# Patient Record
Sex: Female | Born: 1970 | Race: White | Hispanic: No | Smoking: Never smoker
Health system: Southern US, Community
[De-identification: ages and names within clinical notes are randomized; demographics above are authoritative.]

## PROBLEM LIST (undated history)

## (undated) DIAGNOSIS — F419 Anxiety disorder, unspecified: Secondary | ICD-10-CM

## (undated) DIAGNOSIS — R011 Cardiac murmur, unspecified: Secondary | ICD-10-CM

## (undated) DIAGNOSIS — K589 Irritable bowel syndrome without diarrhea: Secondary | ICD-10-CM

## (undated) DIAGNOSIS — I10 Essential (primary) hypertension: Secondary | ICD-10-CM

## (undated) DIAGNOSIS — F329 Major depressive disorder, single episode, unspecified: Secondary | ICD-10-CM

## (undated) DIAGNOSIS — Z8489 Family history of other specified conditions: Secondary | ICD-10-CM

## (undated) DIAGNOSIS — K219 Gastro-esophageal reflux disease without esophagitis: Secondary | ICD-10-CM

## (undated) DIAGNOSIS — N80A0 Endometriosis of bladder, unspecified depth: Secondary | ICD-10-CM

## (undated) DIAGNOSIS — N808 Other endometriosis: Secondary | ICD-10-CM

## (undated) DIAGNOSIS — F32A Depression, unspecified: Secondary | ICD-10-CM

## (undated) DIAGNOSIS — T7840XA Allergy, unspecified, initial encounter: Secondary | ICD-10-CM

## (undated) HISTORY — DX: Cardiac murmur, unspecified: R01.1

## (undated) HISTORY — PX: ABLATION ON ENDOMETRIOSIS: SHX5787

## (undated) HISTORY — DX: Irritable bowel syndrome, unspecified: K58.9

## (undated) HISTORY — DX: Endometriosis of bladder, unspecified depth: N80.A0

## (undated) HISTORY — DX: Other endometriosis: N80.8

## (undated) HISTORY — DX: Depression, unspecified: F32.A

## (undated) HISTORY — DX: Major depressive disorder, single episode, unspecified: F32.9

## (undated) HISTORY — DX: Allergy, unspecified, initial encounter: T78.40XA

## (undated) HISTORY — DX: Anxiety disorder, unspecified: F41.9

---

## 2000-01-25 ENCOUNTER — Other Ambulatory Visit: Admission: RE | Admit: 2000-01-25 | Discharge: 2000-01-25 | Payer: Self-pay | Admitting: Obstetrics and Gynecology

## 2001-02-18 ENCOUNTER — Other Ambulatory Visit: Admission: RE | Admit: 2001-02-18 | Discharge: 2001-02-18 | Payer: Self-pay | Admitting: Obstetrics and Gynecology

## 2001-10-08 ENCOUNTER — Ambulatory Visit (HOSPITAL_COMMUNITY): Admission: RE | Admit: 2001-10-08 | Discharge: 2001-10-08 | Payer: Self-pay | Admitting: Obstetrics and Gynecology

## 2002-03-27 ENCOUNTER — Other Ambulatory Visit: Admission: RE | Admit: 2002-03-27 | Discharge: 2002-03-27 | Payer: Self-pay | Admitting: Obstetrics and Gynecology

## 2003-05-26 ENCOUNTER — Other Ambulatory Visit: Admission: RE | Admit: 2003-05-26 | Discharge: 2003-05-26 | Payer: Self-pay | Admitting: Obstetrics and Gynecology

## 2014-08-03 ENCOUNTER — Ambulatory Visit (INDEPENDENT_AMBULATORY_CARE_PROVIDER_SITE_OTHER): Payer: Self-pay | Admitting: Family Medicine

## 2014-08-03 VITALS — BP 130/90 | HR 66 | Temp 98.0°F | Resp 18 | Ht 61.0 in | Wt 220.6 lb

## 2014-08-03 DIAGNOSIS — Z13220 Encounter for screening for lipoid disorders: Secondary | ICD-10-CM

## 2014-08-03 DIAGNOSIS — B3731 Acute candidiasis of vulva and vagina: Secondary | ICD-10-CM

## 2014-08-03 DIAGNOSIS — I10 Essential (primary) hypertension: Secondary | ICD-10-CM | POA: Insufficient documentation

## 2014-08-03 DIAGNOSIS — B373 Candidiasis of vulva and vagina: Secondary | ICD-10-CM

## 2014-08-03 DIAGNOSIS — R0789 Other chest pain: Secondary | ICD-10-CM

## 2014-08-03 DIAGNOSIS — R300 Dysuria: Secondary | ICD-10-CM

## 2014-08-03 DIAGNOSIS — R0602 Shortness of breath: Secondary | ICD-10-CM

## 2014-08-03 DIAGNOSIS — E669 Obesity, unspecified: Secondary | ICD-10-CM | POA: Insufficient documentation

## 2014-08-03 DIAGNOSIS — Z131 Encounter for screening for diabetes mellitus: Secondary | ICD-10-CM

## 2014-08-03 DIAGNOSIS — Z6841 Body Mass Index (BMI) 40.0 and over, adult: Secondary | ICD-10-CM | POA: Insufficient documentation

## 2014-08-03 LAB — POCT URINALYSIS DIPSTICK
Bilirubin, UA: NEGATIVE
GLUCOSE UA: NEGATIVE
KETONES UA: NEGATIVE
Leukocytes, UA: NEGATIVE
Nitrite, UA: NEGATIVE
RBC UA: NEGATIVE
Spec Grav, UA: 1.015
Urobilinogen, UA: 1
pH, UA: 7.5

## 2014-08-03 LAB — COMPREHENSIVE METABOLIC PANEL
ALK PHOS: 54 U/L (ref 39–117)
ALT: 13 U/L (ref 0–35)
AST: 16 U/L (ref 0–37)
Albumin: 4.3 g/dL (ref 3.5–5.2)
BUN: 22 mg/dL (ref 6–23)
CHLORIDE: 101 meq/L (ref 96–112)
CO2: 27 meq/L (ref 19–32)
Calcium: 9.7 mg/dL (ref 8.4–10.5)
Creat: 0.72 mg/dL (ref 0.50–1.10)
Glucose, Bld: 88 mg/dL (ref 70–99)
Potassium: 5 mEq/L (ref 3.5–5.3)
Sodium: 137 mEq/L (ref 135–145)
Total Bilirubin: 0.6 mg/dL (ref 0.2–1.2)
Total Protein: 7.9 g/dL (ref 6.0–8.3)

## 2014-08-03 LAB — POCT UA - MICROSCOPIC ONLY
CASTS, UR, LPF, POC: NEGATIVE
Crystals, Ur, HPF, POC: NEGATIVE
WBC, Ur, HPF, POC: NEGATIVE
Yeast, UA: POSITIVE

## 2014-08-03 LAB — LIPID PANEL
Cholesterol: 231 mg/dL — ABNORMAL HIGH (ref 0–200)
HDL: 61 mg/dL (ref >=46)
LDL Cholesterol: 129 mg/dL — ABNORMAL HIGH (ref 0–99)
Total CHOL/HDL Ratio: 3.8 Ratio
Triglycerides: 203 mg/dL — ABNORMAL HIGH (ref <150)
VLDL: 41 mg/dL — ABNORMAL HIGH (ref 0–40)

## 2014-08-03 LAB — HEMOGLOBIN A1C
HEMOGLOBIN A1C: 5.2 % (ref <5.7)
Mean Plasma Glucose: 103 mg/dL (ref <117)

## 2014-08-03 MED ORDER — ALBUTEROL SULFATE (2.5 MG/3ML) 0.083% IN NEBU
2.5000 mg | INHALATION_SOLUTION | Freq: Once | RESPIRATORY_TRACT | Status: AC
Start: 2014-08-03 — End: 2014-08-03
  Administered 2014-08-03: 2.5 mg via RESPIRATORY_TRACT

## 2014-08-03 MED ORDER — FLUCONAZOLE 150 MG PO TABS: 150.0000 mg | ORAL_TABLET | Freq: Once | ORAL | Status: DC

## 2014-08-03 MED ORDER — GI COCKTAIL ~~LOC~~
30.0000 mL | Freq: Once | ORAL | Status: AC
  Administered 2014-08-03: 30 mL via ORAL

## 2014-08-03 MED ORDER — ATENOLOL 50 MG PO TABS: 50.0000 mg | ORAL_TABLET | Freq: Every day | ORAL | Status: DC

## 2014-08-03 NOTE — Patient Instructions (Signed)
I will be in touch with the rest of your labs asap- if you like set up your mychart account for the fastest results Try taking OTC zantac or prilosec for 2 weeks, and avoid acidic foods such as tomatoes Let me know if these measures do not resolve your chest discomfort Continue the atenolol for now It looks like your urinary sx are due to yeast vaginitis- take the diflucan as directed for this If you have any worsening or changing of your chest symptoms please seek care   Ir your BP is getting down to 120/80 or so we may need to decrease or stop your atenolol- let me know

## 2014-08-03 NOTE — Progress Notes (Addendum)
[facility] [address], [place] [address] [phone]  Date:  08/03/2014   Name:  Ashlee Farmer   DOB:  03-27-1970   MRN:  [id number]  PCP:   Duane Lope, MD    Chief Complaint: Urinary Frequency; requesting blood work; and Shortness of Breath   History of Present Illness:  Ashlee Farmer is a 44 y.o. very pleasant female patient who presents with the following:  Here today as a new patient with complaint of a few concerns A couple of weeks ago she started seeing a personal trainer who wanted her to have a check-up prior to starting her program.  She went to another office last week for this and also for concern of UTI.  She had a BP of 170/105 when she was seen at the other office, started on atenolol 50 mg once a day Also she has noted urinary frequency and urgency.  She will urinate, feel better for a few minutes and then have to go again. This has been coming and going for a few months- more persistent as of late. She has not seen any hematuria.  No abd or back pain  States that her urine was also checked at other facility but she is not sure of results  Family history of DM.   She has not had any BW done recently- would like to do so today She late ate around 9:30 am  She also has noted a chest heaviness in her left chest.  Exercise makes it BETTER.  This seemed to start when she had a URI/onset of seasonal allergies in April of this year.  She has been dealing with it off an on since then.  She notes a mild sensation of chest heaviness now.  Wonders if this might be GERD which she has had in the past She has used some sudafed- it does seem to help with congestion  She has a heart murmur but no other cardiac issues.  Her father did die last year at the age of 62 due to CV disease Never a smoker  She has cut off sugar and caffiene over the last 2 weeks.  LMP 6/30.  States no change of pregnancy Her BP at [facility] has run 150/ 105- 110, best  155/89  She admits that she has noted some vaginal discharge and itching    There are no active problems to display for this patient.   Past Medical History  Diagnosis Date  . Allergy   . Anxiety   . Depression   . Heart murmur   . Endometriosis of bladder   . IBS (irritable bowel syndrome)     History reviewed. No pertinent past surgical history.  History  Substance Use Topics  . Smoking status: Never Smoker   . Smokeless tobacco: Not on file  . Alcohol Use: Not on file    Family History  Problem Relation Age of Onset  . Diabetes Mother   . Hyperlipidemia Mother   . Hypertension Mother   . Diabetes Father   . Heart disease Father   . Hyperlipidemia Father   . Hypertension Father   . Hyperlipidemia Brother   . Hypertension Brother     Allergies not on file  Medication list has been reviewed and updated.  No current outpatient prescriptions on file prior to visit.   No current facility-administered medications on file prior to visit.    Review of Systems:  As per HPI-  otherwise negative.   Physical Examination: Filed Vitals:   08/03/14 1240  BP: 130/90  Pulse: 66  Temp: 98 F (36.7 C)  Resp: 18   Filed Vitals:   08/03/14 1240  Height:  (1.549 m)  Weight: 220 lb 9.6 oz (100.064 kg)   Body mass index is 41.7 kg/(m^2). Ideal Body Weight: Weight in (lb) to have BMI = 25: 132  GEN: WDWN, NAD, Non-toxic, A & O x 3, obese, looks well HEENT: Atraumatic, Normocephalic. Neck supple. No masses, No LAD. Ears and Nose: No external deformity. CV: RRR, No M/G/R. No JVD. No thrill. No extra heart sounds.  Cannot reproduce chest sx by pressing on chest wall PULM: CTA B, no wheezes, crackles, rhonchi. No retractions. No resp. distress. No accessory muscle use. ABD: S, NT, ND EXTR: No c/c/e NEURO Normal gait.  PSYCH: Normally interactive. Conversant. Not depressed or anxious appearing.  Calm demeanor.   albuterol neb; did not change her sx GI  cocktail- resolved sx of chest tightness and heaviness complelty  Assessment and Plan: Dysuria - Plan: POCT urinalysis dipstick, POCT UA - Microscopic Only, Urine culture, fluconazole (DIFLUCAN) 150 MG tablet  Screening for diabetes mellitus - Plan: Comprehensive metabolic panel, Hemoglobin A1c  Screening for hyperlipidemia - Plan: Lipid panel  SOB (shortness of breath) - Plan: albuterol (PROVENTIL) (2.5 MG/3ML) 0.083% nebulizer solution 2.5 mg  Chest discomfort - Plan: gi cocktail (Maalox,Lidocaine,Donnatal)  Yeast vaginitis - Plan: fluconazole (DIFLUCAN) 150 MG tablet  Essential hypertension - Plan: atenolol (TENORMIN) 50 MG tablet  Here today with a few concerns Screening labs pending as above She has noted intermittent chest pressure for a few month, wonders if it might be GERD A GI cocktail resolved her current chest sx.  Offered an EKG and/ or CXR to look for any other possible pathology.  She declines for now in the interest of cost, but will seek care if this continues to occur or does not resolve with treatment for GERD It appears that her urinary sx are likely due to yeast vaginitis- treat with diflucan She is ok to exercise as she likes, but consider a stress test as she gets older given her dad's history  Signed Abbe Amsterdam, MD  7/14: Received her urine culture- positive for enterococcus.  Called but did not reach. Tried again 7/15: still no answer.  LMOM that I am sending an rx for UTI to her drug store. Will send a letter abut other labs Results for orders placed or performed in visit on 08/03/14  Urine culture  Result Value Ref Range   Colony Count 60,000 COLONIES/ML    Preliminary Report ENTEROCOCCUS SPECIES   Comprehensive metabolic panel  Result Value Ref Range   Sodium 137 135 - 145 mEq/L   Potassium 5.0 3.5 - 5.3 mEq/L   Chloride 101 96 - 112 mEq/L   CO2 27 19 - 32 mEq/L   Glucose, Bld 88 70 - 99 mg/dL   BUN 22 6 - 23 mg/dL   Creat 1.61 0.96 - 0.45  mg/dL   Total Bilirubin 0.6 0.2 - 1.2 mg/dL   Alkaline Phosphatase 54 39 - 117 U/L   AST 16 0 - 37 U/L   ALT 13 0 - 35 U/L   Total Protein 7.9 6.0 - 8.3 g/dL   Albumin 4.3 3.5 - 5.2 g/dL   Calcium 9.7 8.4 - 40.9 mg/dL  Lipid panel  Result Value Ref Range   Cholesterol 231 (H) 0 - 200 mg/dL  Triglycerides 203 (H) <150 mg/dL   HDL 61 >=62>=46 mg/dL   Total CHOL/HDL Ratio 3.8 Ratio   VLDL 41 (H) 0 - 40 mg/dL   LDL Cholesterol 130129 (H) 0 - 99 mg/dL  Hemoglobin Q6VA1c  Result Value Ref Range   Hgb A1c MFr Bld 5.2 <5.7 %   Mean Plasma Glucose 103 <117 mg/dL  POCT urinalysis dipstick  Result Value Ref Range   Color, UA yellow    Clarity, UA clear    Glucose, UA negative    Bilirubin, UA negative    Ketones, UA negative    Spec Grav, UA 1.015    Blood, UA negative    pH, UA 7.5    Protein, UA trace    Urobilinogen, UA 1.0    Nitrite, UA negative    Leukocytes, UA Negative Negative  POCT UA - Microscopic Only  Result Value Ref Range   WBC, Ur, HPF, POC negative    RBC, urine, microscopic 1-4    Bacteria, U Microscopic trace    Mucus, UA trace    Epithelial cells, urine per micros 0-7    Crystals, Ur, HPF, POC negative    Casts, Ur, LPF, POC negative    Yeast, UA positive

## 2014-08-05 ENCOUNTER — Telehealth: Payer: Self-pay

## 2014-08-05 NOTE — Telephone Encounter (Signed)
PATIENT STATES SHE SAW DR. Patsy LagerOPLAND ON Monday FOR HER HIGH BLOOD PRESSURE. SHE NEEDS DR. Patsy LagerOPLAND TO WRITE A NOTE FOR HER GYM TRAINER SAYING THAT IT IS ALL RIGHT FOR HER TO WORK OUT AND THAT SHE DOES NOT HAVE ANY RESTRICTIONS. SHE WILL PICK THE NOTE UP WHEN IT IS READY.  BEST PHONE [phone] (CELL) MBC

## 2014-08-05 NOTE — Telephone Encounter (Signed)
Called her and will give her a note for her personal trainer to pick up tomorrow . She is feeling well, urine culture is still pending

## 2014-08-07 ENCOUNTER — Encounter: Payer: Self-pay | Admitting: Family Medicine

## 2014-08-07 LAB — URINE CULTURE

## 2014-08-07 MED ORDER — NITROFURANTOIN MONOHYD MACRO 100 MG PO CAPS: 100.0000 mg | ORAL_CAPSULE | Freq: Two times a day (BID) | ORAL | Status: DC

## 2014-08-07 NOTE — Addendum Note (Signed)
Addended by: Abbe AmsterdamOPLAND, Cathye Kreiter C on: 08/07/2014 12:53 PM   Modules accepted: Orders

## 2014-08-29 ENCOUNTER — Ambulatory Visit (INDEPENDENT_AMBULATORY_CARE_PROVIDER_SITE_OTHER): Payer: Self-pay | Admitting: Emergency Medicine

## 2014-08-29 VITALS — BP 124/92 | HR 63 | Temp 98.0°F | Ht 63.0 in | Wt 217.0 lb

## 2014-08-29 DIAGNOSIS — H6983 Other specified disorders of Eustachian tube, bilateral: Secondary | ICD-10-CM

## 2014-08-29 DIAGNOSIS — H60333 Swimmer's ear, bilateral: Secondary | ICD-10-CM

## 2014-08-29 MED ORDER — MOMETASONE FUROATE 50 MCG/ACT NA SUSP: 2.0000 | Freq: Every day | NASAL | Status: DC

## 2014-08-29 MED ORDER — NEOMYCIN-COLIST-HC-THONZONIUM 3.3-3-10-0.5 MG/ML OT SUSP: 3.0000 [drp] | Freq: Four times a day (QID) | OTIC | Status: DC

## 2014-08-29 MED ORDER — NEOMYCIN-POLYMYXIN-HC 3.5-10000-1 OT SOLN: 3.0000 [drp] | Freq: Four times a day (QID) | OTIC | Status: DC

## 2014-08-29 NOTE — Addendum Note (Signed)
Addended by: Felix Ahmadi A on: 08/29/2014 11:25 AM   Modules accepted: Orders, Medications

## 2014-08-29 NOTE — Progress Notes (Signed)
Subjective:  Patient ID: Ashlee Farmer, female    DOB: 1970/08/25  Age: 44 y.o. MRN: [id number]  CC: Ear Pain and Nasal Congestion   HPI Ashlee Farmer presents  with pain in both ears. She said she's been swimming a lot recently has no drainage or fever or chills. Has no cough or coryza. She has no postnasal drainage or sore throat. She has difficulty clearing both ears. Said no improvement with over-the-counter medication has no particular factor that makes pain worse or better.  History Ashlee Farmer has a past medical history of Allergy; Anxiety; Depression; Heart murmur; Endometriosis of bladder; and IBS (irritable bowel syndrome).   She has no past surgical history on file.   Her  family history includes Diabetes in her father and mother; Heart disease in her father; Hyperlipidemia in her brother, father, and mother; Hypertension in her brother, father, and mother.  She   reports that she has never smoked. She does not have any smokeless tobacco history on file. She reports that she does not use illicit drugs. Her alcohol history is not on file.  Outpatient Prescriptions Prior to Visit  Medication Sig Dispense Refill  . atenolol (TENORMIN) 50 MG tablet Take 1 tablet (50 mg total) by mouth daily. 30 tablet 6  . Pseudoephedrine-DM-GG (SUDAFED COUGH PO) Take by mouth.    . fluconazole (DIFLUCAN) 150 MG tablet Take 1 tablet (150 mg total) by mouth once. Repeat in a week as needed for yeast vaginitis 2 tablet 0  . nitrofurantoin, macrocrystal-monohydrate, (MACROBID) 100 MG capsule Take 1 capsule (100 mg total) by mouth 2 (two) times daily. 14 capsule 0   No facility-administered medications prior to visit.    History   Social History  . Marital Status: Divorced    Spouse Name: N/A  . Number of Children: N/A  . Years of Education: N/A   Social History Main Topics  . Smoking status: Never Smoker   . Smokeless tobacco: Not on file  . Alcohol Use: Not on file  . Drug Use: No    . Sexual Activity: Not on file   Other Topics Concern  . None   Social History Narrative     Review of Systems  Constitutional: Negative for fever, chills and appetite change.  HENT: Negative for congestion, ear pain, postnasal drip, sinus pressure and sore throat.   Eyes: Negative for pain and redness.  Respiratory: Negative for cough, shortness of breath and wheezing.   Cardiovascular: Negative for leg swelling.  Gastrointestinal: Negative for nausea, vomiting, abdominal pain, diarrhea, constipation and blood in stool.  Endocrine: Negative for polyuria.  Genitourinary: Negative for dysuria, urgency, frequency and flank pain.  Musculoskeletal: Negative for gait problem.  Skin: Negative for rash.  Neurological: Negative for weakness and headaches.  Psychiatric/Behavioral: Negative for confusion and decreased concentration. The patient is not nervous/anxious.     Objective:  BP 124/92 mmHg  Pulse 63  Temp(Src) 98 F (36.7 C) (Oral)  Ht 5\' 3"  (1.6 m)  Wt 217 lb (98.431 kg)  BMI 38.45 kg/m2  SpO2 98%  LMP 07/23/2014 (LMP Unknown)  Physical Exam  Constitutional: She is oriented to person, place, and time. She appears well-developed and well-nourished.  HENT:  Head: Normocephalic and atraumatic.  Eyes: Conjunctivae are normal. Pupils are equal, round, and reactive to light.  Pulmonary/Chest: Effort normal.  Musculoskeletal: She exhibits no edema.  Neurological: She is alert and oriented to person, place, and time.  Skin: Skin is dry.  Psychiatric:  She has a normal mood and affect. Her behavior is normal. Thought content normal.      Assessment & Plan:   Ashlee Farmer was seen today for ear pain and nasal congestion.  Diagnoses and all orders for this visit:  Swimmer's ear of both sides  Eustachian tube dysfunction, bilateral  Other orders -     mometasone (NASONEX) 50 MCG/ACT nasal spray; Place 2 sprays into the nose daily. -      neomycin-colistin-hydrocortisone-thonzonium (CORTISPORIN-TC) 3.03-25-08-0.5 MG/ML otic suspension; Place 3 drops into both ears 4 (four) times daily.   I have discontinued Ms. Dietzman fluconazole and nitrofurantoin (macrocrystal-monohydrate). I am also having her start on mometasone and neomycin-colistin-hydrocortisone-thonzonium. Additionally, I am having her maintain her Pseudoephedrine-DM-GG (SUDAFED COUGH PO) and atenolol.  Meds ordered this encounter  Medications  . mometasone (NASONEX) 50 MCG/ACT nasal spray    Sig: Place 2 sprays into the nose daily.    Dispense:  17 g    Refill:  12  . neomycin-colistin-hydrocortisone-thonzonium (CORTISPORIN-TC) 3.03-25-08-0.5 MG/ML otic suspension    Sig: Place 3 drops into both ears 4 (four) times daily.    Dispense:  10 mL    Refill:  0    Appropriate red flag conditions were discussed with the patient as well as actions that should be taken.  Patient expressed his understanding.  Follow-up: Return if symptoms worsen or fail to improve.  Carmelina Dane, MD

## 2014-08-29 NOTE — Patient Instructions (Signed)

## 2015-01-11 ENCOUNTER — Ambulatory Visit (INDEPENDENT_AMBULATORY_CARE_PROVIDER_SITE_OTHER): Payer: Self-pay | Admitting: Family Medicine

## 2015-01-11 VITALS — BP 160/110 | HR 87 | Temp 98.9°F | Ht 62.5 in | Wt 232.0 lb

## 2015-01-11 DIAGNOSIS — J0101 Acute recurrent maxillary sinusitis: Secondary | ICD-10-CM

## 2015-01-11 MED ORDER — AMOXICILLIN 500 MG PO TABS: 1000.0000 mg | ORAL_TABLET | Freq: Three times a day (TID) | ORAL | Status: DC

## 2015-01-11 MED ORDER — FLUTICASONE PROPIONATE 50 MCG/ACT NA SUSP: 2.0000 | Freq: Every day | NASAL | Status: DC

## 2015-01-11 NOTE — Patient Instructions (Signed)

## 2015-01-11 NOTE — Progress Notes (Signed)
Subjective:  This chart was scribed for  Ashlee SorensonEva Shaw MD, by [name],scribe, at [facility].  This patient was seen in room 13 and the patient's care was started at 8:48 AM.   Chief Complaint  Patient presents with  . Sinusitis    x 1 week.  Pain in L/cheek, teeth and ear  . Otalgia    left sided  . Nasal Congestion  . Hypertension    at check in  - 160/110     Patient ID: Ashlee DibbleSheila A Dittus, female    DOB: [birth year], 44 y.o.   MRN: [id number]  HPI  HPI Comments: Ashlee Farmer is a [age] y.o. female who presents to the [facility] complaining of ear pain, nasal congestion/sinus pressure, and left sided facial pain which she states she has been having for the past couple of months.  She does have a dental issue on the left side of her mouth but states that she has not yet seen a dentist for it.  Patient notes of having sinus issues frequently.  She has been using sudafed pressure and pain as well as aleve but denies any relief.  She last took her medication this morning.   She is not using Nasonex ( due to financial reasons)  but uses a EchoStareti Pot as well as peroxide in her ears occasionally. Denies fever/chills, sore throat, cough.    Past Medical History  Diagnosis Date  . Allergy   . Anxiety   . Depression   . Heart murmur   . Endometriosis of bladder   . IBS (irritable bowel syndrome)    Current Outpatient Prescriptions on File Prior to Visit  Medication Sig Dispense Refill  . atenolol (TENORMIN) 50 MG tablet Take 1 tablet (50 mg total) by mouth daily. 30 tablet 6  . mometasone (NASONEX) 50 MCG/ACT nasal spray Place 2 sprays into the nose daily. 17 g 12  . neomycin-polymyxin-hydrocortisone (CORTISPORIN) otic solution Place 3 drops into both ears 4 (four) times daily. (Patient not taking: Reported on 01/11/2015) 10 mL 0   No current facility-administered medications on file prior to visit.   No Known Allergies   Review of Systems    Constitutional: Negative for fever and chills.  HENT: Positive for congestion, dental problem, ear pain, postnasal drip and sinus pressure. Negative for sore throat.   Eyes: Negative for pain, redness and itching.  Respiratory: Negative for cough, choking and shortness of breath.   Musculoskeletal: Negative for gait problem, neck pain and neck stiffness.  Neurological: Negative for syncope and speech difficulty.       Objective:   Physical Exam  Constitutional: She is oriented to person, place, and time. She appears well-developed and well-nourished. No distress.  HENT:  Head: Normocephalic and atraumatic.  Left Tm normal, right TM with a mid ear effusion.  septal deviation to the left.  Little cobblestoning and post nasal drip.   Eyes: Pupils are equal, round, and reactive to light.  Neck: Normal range of motion. No thyromegaly present.  No pre or post auricular adenopathy.  Anterior cervical adenopathy.  TMJ normal, no pain at mastoid or angle at mandible.   Cardiovascular: Normal rate, regular rhythm, S1 normal, S2 normal and normal heart sounds.  Exam reveals no gallop and no friction rub.   No murmur heard. Pulmonary/Chest:  Little bronchial breath sounds on the left but good air movement and clear to auscultation.   Musculoskeletal: Normal range of  motion.  Lymphadenopathy:    She has no cervical adenopathy.  Neurological: She is alert and oriented to person, place, and time.  Skin: Skin is warm and dry.  Psychiatric: She has a normal mood and affect. Her behavior is normal.    Filed Vitals:   01/11/15 0834  BP: 160/110  Pulse: 87  Temp: 98.9 F (37.2 C)  TempSrc: Oral  Height: 5' 2.5" (1.588 m)  Weight: 232 lb (105.235 kg)  SpO2: 99%         Assessment & Plan:   If no improvement within three days, call and we will switch to Augmentin or Z-pack.   1. Acute recurrent maxillary sinusitis     Meds ordered this encounter  Medications  . amoxicillin  (AMOXIL) 500 MG tablet    Sig: Take 2 tablets (1,000 mg total) by mouth 3 (three) times daily. For 2 days and then 2 tabs po bid x 8d    Dispense:  44 tablet    Refill:  0  . fluticasone (FLONASE) 50 MCG/ACT nasal spray    Sig: Place 2 sprays into both nostrils at bedtime.    Dispense:  16 g    Refill:  2    I personally performed the services described in this documentation, which was scribed in my presence. The recorded information has been reviewed and considered, and addended by me as needed.  Ashlee Sorenson, MD MPH

## 2015-01-27 ENCOUNTER — Telehealth: Payer: Self-pay

## 2015-01-27 MED ORDER — PREDNISONE 20 MG PO TABS: ORAL_TABLET | ORAL | Status: DC

## 2015-01-27 MED ORDER — SULFAMETHOXAZOLE-TRIMETHOPRIM 800-160 MG PO TABS: 1.0000 | ORAL_TABLET | Freq: Two times a day (BID) | ORAL | Status: DC

## 2015-01-27 NOTE — Telephone Encounter (Signed)
Assessment & Plan:   If no improvement within three days, call and we will switch to Augmentin or Z-pack.   1. Acute recurrent maxillary sinusitis

## 2015-01-27 NOTE — Telephone Encounter (Signed)
Pt was seen on 12/19 by Dr. Clelia CroftShaw for Acute recurrent maxillary sinusitis - Primary. She has finished her antibiotic a few days ago and is now experiencing pain and pressure. She states she was told she could get a steroid if this didn't clear up. She would like us to use the Pharmacy:  [id number] - [place] [employer], [place] - [address] CB #   [phone]

## 2015-01-27 NOTE — Telephone Encounter (Signed)
Sent in course of prednisone and bactrim.  She needs to go to get her blood pressure checked asap.  If her sxs recur after this, then she definitely needs to be seen as there  could be other causes of her sxs that we would not want to ignore.

## 2015-01-28 NOTE — Telephone Encounter (Signed)
Called pt and advised message from provider on their voicemail.  

## 2015-07-01 ENCOUNTER — Ambulatory Visit (INDEPENDENT_AMBULATORY_CARE_PROVIDER_SITE_OTHER): Payer: Self-pay | Admitting: Physician Assistant

## 2015-07-01 VITALS — BP 132/80 | HR 88 | Temp 98.6°F | Resp 17 | Ht 63.0 in | Wt 245.0 lb

## 2015-07-01 DIAGNOSIS — R6884 Jaw pain: Secondary | ICD-10-CM

## 2015-07-01 LAB — POCT CBC
Granulocyte percent: 73.1 % (ref 37–80)
HCT, POC: 39.5 % (ref 37.7–47.9)
Hemoglobin: 13.3 g/dL (ref 12.2–16.2)
Lymph, poc: 1.9 (ref 0.6–3.4)
MCH, POC: 29.6 pg (ref 27–31.2)
MCHC: 33.7 g/dL (ref 31.8–35.4)
MCV: 87.8 fL (ref 80–97)
MID (cbc): 0.6 (ref 0–0.9)
MPV: 7.3 fL (ref 0–99.8)
POC Granulocyte: 6.7 (ref 2–6.9)
POC LYMPH PERCENT: 20.6 % (ref 10–50)
POC MID %: 6.3 % (ref 0–12)
Platelet Count, POC: 257 K/uL (ref 142–424)
RBC: 4.49 M/uL (ref 4.04–5.48)
RDW, POC: 14.1 %
WBC: 9.2 K/uL (ref 4.6–10.2)

## 2015-07-01 MED ORDER — PREDNISONE 20 MG PO TABS: ORAL_TABLET | ORAL | Status: AC

## 2015-07-01 NOTE — Patient Instructions (Signed)
     IF you received an x-ray today, you will receive an invoice from [facility]. Please contact [facility] at [phone] with questions or concerns regarding your invoice.   IF you received labwork today, you will receive an invoice from [facility]. Please contact [facility] at [phone] with questions or concerns regarding your invoice.   Our billing staff will not be able to assist you with questions regarding bills from these companies.  You will be contacted with the lab results as soon as they are available. The fastest way to get your results is to activate your My Chart account. Instructions are located on the last page of this paperwork. If you have not heard from us regarding the results in 2 weeks, please contact this office.      

## 2015-07-01 NOTE — Progress Notes (Signed)
07/01/2015 11:52 AM   DOB: 1970-06-27 / MRN: [id number]  SUBJECTIVE:  Lorna DibbleSheila A Simi is a 45 y.o. female presenting for left sided ear pain and jaw pain.  Complains of a chronic history of this but has been worse in the last 5 days.  She also has a history of impacted wisdom teeth.  Feels that she is getting worse.  Opening her jaw makes the pain worse and she is unable to fully open her mouth.  She is tolerating po however.  She denies fever, chills, nausea.  The last time she had bad jaw pain she came here and was started on Abx which did not help, and was subsequently placed on steroids and this alleviated her symptoms.  She has no insurance, does not seen the dentist and has never seen an ENT either.   She has No Known Allergies.   She  has a past medical history of Allergy; Anxiety; Depression; Heart murmur; Endometriosis of bladder; and IBS (irritable bowel syndrome).    She  reports that she has never smoked. She does not have any smokeless tobacco history on file. She reports that she does not use illicit drugs. She  has no sexual activity history on file. The patient  has no past surgical history on file.  Her family history includes Diabetes in her father and mother; Heart disease in her father; Hyperlipidemia in her brother, father, and mother; Hypertension in her brother, father, and mother.  Review of Systems  Constitutional: Negative for fever and chills.  HENT: Positive for ear pain. Negative for congestion, ear discharge, nosebleeds and sore throat.   Respiratory: Negative for cough.   Cardiovascular: Negative for chest pain.  Skin: Negative for rash.  Neurological: Negative for dizziness and headaches.    Problem list and medications reviewed and updated by myself where necessary, and exist elsewhere in the encounter.   OBJECTIVE:  BP 132/80 mmHg  Pulse 88  Temp(Src) 98.6 F (37 C) (Oral)  Resp 17  Ht 5\' 3"  (1.6 m)  Wt 245 lb (111.131 kg)  BMI 43.41 kg/m2  SpO2  97%  LMP 06/17/2015  Physical Exam  Constitutional: She is oriented to person, place, and time. She appears well-developed.  Eyes: EOM are normal. Pupils are equal, round, and reactive to light.  Cardiovascular: Normal rate, regular rhythm and normal heart sounds.   Pulmonary/Chest: Effort normal and breath sounds normal.  Abdominal: She exhibits no distension.  Musculoskeletal: Normal range of motion.  Neurological: She is alert and oriented to person, place, and time. No cranial nerve deficit.  Skin: Skin is warm and dry. She is not diaphoretic.  Psychiatric: She has a normal mood and affect.  Vitals reviewed.   Results for orders placed or performed in visit on 07/01/15 (from the past 72 hour(s))  POCT CBC     Status: None   Collection Time: 07/01/15 11:42 AM  Result Value Ref Range   WBC 9.2 4.6 - 10.2 K/uL   Lymph, poc 1.9 0.6 - 3.4   POC LYMPH PERCENT 20.6 10 - 50 %L   MID (cbc) 0.6 0 - 0.9   POC MID % 6.3 0 - 12 %M   POC Granulocyte 6.7 2 - 6.9   Granulocyte percent 73.1 37 - 80 %G   RBC 4.49 4.04 - 5.48 M/uL   Hemoglobin 13.3 12.2 - 16.2 g/dL   HCT, POC 21.339.5 08.637.7 - 47.9 %   MCV 87.8 80 - 97 fL   MCH,  POC 29.6 27 - 31.2 pg   MCHC 33.7 31.8 - 35.4 g/dL   RDW, POC 40.9 %   Platelet Count, POC 257 142 - 424 K/uL   MPV 7.3 0 - 99.8 fL    No results found.  ASSESSMENT AND PLAN  Evonna was seen today for otitis media and jaw pain.  Diagnoses and all orders for this visit:  Jaw pain: CBC negative.  Will go ahead and get her prednisone as this worked last time.  I advised that we xray the jaw today but she is self pay and concerned with cost.  Advised that she get insurance and see a dentist or an ENT.  -     POCT CBC   The patient was advised to call or return to clinic if she does not see an improvement in symptoms or to seek the care of the closest emergency department if she worsens with the above plan.   Deliah Boston, MHS, PA-C [facility] 07/01/2015 11:52 AM

## 2020-08-29 ENCOUNTER — Other Ambulatory Visit: Payer: Self-pay

## 2020-08-29 ENCOUNTER — Inpatient Hospital Stay (HOSPITAL_BASED_OUTPATIENT_CLINIC_OR_DEPARTMENT_OTHER)
Admission: EM | Admit: 2020-08-29 | Discharge: 2020-09-02 | DRG: 176 | Disposition: A | Discharge status: Home / Self Care | Payer: Self-pay | Attending: Internal Medicine | Admitting: Internal Medicine

## 2020-08-29 ENCOUNTER — Encounter (HOSPITAL_BASED_OUTPATIENT_CLINIC_OR_DEPARTMENT_OTHER): Payer: Self-pay | Admitting: Emergency Medicine

## 2020-08-29 ENCOUNTER — Emergency Department: Payer: Self-pay

## 2020-08-29 ENCOUNTER — Emergency Department (HOSPITAL_BASED_OUTPATIENT_CLINIC_OR_DEPARTMENT_OTHER): Payer: Self-pay

## 2020-08-29 DIAGNOSIS — I169 Hypertensive crisis, unspecified: Secondary | ICD-10-CM | POA: Diagnosis present

## 2020-08-29 DIAGNOSIS — I10 Essential (primary) hypertension: Secondary | ICD-10-CM | POA: Diagnosis present

## 2020-08-29 DIAGNOSIS — R109 Unspecified abdominal pain: Secondary | ICD-10-CM

## 2020-08-29 DIAGNOSIS — K8020 Calculus of gallbladder without cholecystitis without obstruction: Secondary | ICD-10-CM | POA: Diagnosis present

## 2020-08-29 DIAGNOSIS — Z83438 Family history of other disorder of lipoprotein metabolism and other lipidemia: Secondary | ICD-10-CM

## 2020-08-29 DIAGNOSIS — R0602 Shortness of breath: Secondary | ICD-10-CM

## 2020-08-29 DIAGNOSIS — K589 Irritable bowel syndrome without diarrhea: Secondary | ICD-10-CM | POA: Diagnosis present

## 2020-08-29 DIAGNOSIS — Z6841 Body Mass Index (BMI) 40.0 and over, adult: Secondary | ICD-10-CM

## 2020-08-29 DIAGNOSIS — Z8249 Family history of ischemic heart disease and other diseases of the circulatory system: Secondary | ICD-10-CM

## 2020-08-29 DIAGNOSIS — R252 Cramp and spasm: Secondary | ICD-10-CM | POA: Diagnosis present

## 2020-08-29 DIAGNOSIS — Z597 Insufficient social insurance and welfare support: Secondary | ICD-10-CM

## 2020-08-29 DIAGNOSIS — Z7982 Long term (current) use of aspirin: Secondary | ICD-10-CM

## 2020-08-29 DIAGNOSIS — K760 Fatty (change of) liver, not elsewhere classified: Secondary | ICD-10-CM | POA: Diagnosis present

## 2020-08-29 DIAGNOSIS — R1900 Intra-abdominal and pelvic swelling, mass and lump, unspecified site: Secondary | ICD-10-CM | POA: Diagnosis present

## 2020-08-29 DIAGNOSIS — E785 Hyperlipidemia, unspecified: Secondary | ICD-10-CM | POA: Diagnosis present

## 2020-08-29 DIAGNOSIS — I2699 Other pulmonary embolism without acute cor pulmonale: Principal | ICD-10-CM | POA: Diagnosis present

## 2020-08-29 DIAGNOSIS — Z20822 Contact with and (suspected) exposure to covid-19: Secondary | ICD-10-CM | POA: Diagnosis present

## 2020-08-29 DIAGNOSIS — I161 Hypertensive emergency: Secondary | ICD-10-CM | POA: Diagnosis present

## 2020-08-29 DIAGNOSIS — Z833 Family history of diabetes mellitus: Secondary | ICD-10-CM

## 2020-08-29 DIAGNOSIS — K219 Gastro-esophageal reflux disease without esophagitis: Secondary | ICD-10-CM | POA: Diagnosis present

## 2020-08-29 HISTORY — DX: Other pulmonary embolism without acute cor pulmonale: I26.99

## 2020-08-29 LAB — CBC
HCT: 42.5 % (ref 36.0–46.0)
Hemoglobin: 13.7 g/dL (ref 12.0–15.0)
MCH: 28.2 pg (ref 26.0–34.0)
MCHC: 32.2 g/dL (ref 30.0–36.0)
MCV: 87.6 fL (ref 80.0–100.0)
Platelets: 156 10*3/uL (ref 150–400)
RBC: 4.85 MIL/uL (ref 3.87–5.11)
RDW: 14.1 % (ref 11.5–15.5)
WBC: 8.9 10*3/uL (ref 4.0–10.5)
nRBC: 0 % (ref 0.0–0.2)

## 2020-08-29 LAB — BASIC METABOLIC PANEL
Anion gap: 12 (ref 5–15)
BUN: 14 mg/dL (ref 6–20)
CO2: 27 mmol/L (ref 22–32)
Calcium: 9.8 mg/dL (ref 8.9–10.3)
Chloride: 99 mmol/L (ref 98–111)
Creatinine, Ser: 0.9 mg/dL (ref 0.44–1.00)
GFR, Estimated: >60 mL/min (ref >60)
Glucose, Bld: 100 mg/dL — ABNORMAL HIGH (ref 70–99)
Potassium: 4 mmol/L (ref 3.5–5.1)
Sodium: 138 mmol/L (ref 135–145)

## 2020-08-29 LAB — PREGNANCY, URINE: Preg Test, Ur: NEGATIVE

## 2020-08-29 LAB — TROPONIN I (HIGH SENSITIVITY)
Troponin I (High Sensitivity): 5 ng/L (ref <18)
Troponin I (High Sensitivity): 7 ng/L (ref <18)

## 2020-08-29 LAB — RESP PANEL BY RT-PCR (FLU A&B, COVID) ARPGX2
Influenza A by PCR: NEGATIVE
Influenza B by PCR: NEGATIVE
SARS Coronavirus 2 by RT PCR: NEGATIVE

## 2020-08-29 MED ORDER — NICARDIPINE HCL IN NACL 20-0.86 MG/200ML-% IV SOLN
3.0000 mg/h | INTRAVENOUS | Status: DC
  Administered 2020-08-29: 5 mg/h via INTRAVENOUS
  Filled 2020-08-29: qty 200

## 2020-08-29 MED ORDER — ENOXAPARIN SODIUM 150 MG/ML IJ SOSY
1.0000 mg/kg | PREFILLED_SYRINGE | Freq: Two times a day (BID) | INTRAMUSCULAR | Status: DC
  Administered 2020-08-29: 126 mg via SUBCUTANEOUS
  Filled 2020-08-29: qty 1

## 2020-08-29 MED ORDER — IOHEXOL 350 MG/ML SOLN
100.0000 mL | Freq: Once | INTRAVENOUS | Status: AC | PRN
  Administered 2020-08-29: 100 mL via INTRAVENOUS

## 2020-08-29 MED ORDER — LABETALOL HCL 5 MG/ML IV SOLN
20.0000 mg | Freq: Once | INTRAVENOUS | Status: AC
  Administered 2020-08-29: 20 mg via INTRAVENOUS
  Filled 2020-08-29: qty 4

## 2020-08-29 MED ORDER — ASPIRIN 81 MG PO CHEW
324.0000 mg | CHEWABLE_TABLET | Freq: Once | ORAL | Status: DC
  Filled 2020-08-29 (×2): qty 4

## 2020-08-29 MED ORDER — NITROGLYCERIN 0.4 MG SL SUBL
0.4000 mg | SUBLINGUAL_TABLET | SUBLINGUAL | Status: AC | PRN
Start: 2020-08-29 — End: 2020-09-01
  Administered 2020-08-29 – 2020-09-01 (×3): 0.4 mg via SUBLINGUAL
  Filled 2020-08-29 (×3): qty 1

## 2020-08-29 MED ORDER — LABETALOL HCL 5 MG/ML IV SOLN
10.0000 mg | Freq: Once | INTRAVENOUS | Status: AC
  Administered 2020-08-29: 10 mg via INTRAVENOUS
  Filled 2020-08-29: qty 4

## 2020-08-29 NOTE — ED Notes (Signed)
Dr Criss Alvine aware of pts HTN

## 2020-08-29 NOTE — ED Triage Notes (Signed)
Pt presents to ED POV. Pt c/o CP that radiates towards back w/ SOB. Pt reports that she woke up w/ it this morning.

## 2020-08-29 NOTE — ED Notes (Signed)
Pt states she has a lot of Gas, does not feel like reflux just a gas feeling

## 2020-08-29 NOTE — ED Provider Notes (Signed)
[facility] EMERGENCY DEPT Provider Note   CSN: [id number] Arrival date & time: 08/29/20  1656     History Chief Complaint  Patient presents with   Chest Pain    Ashlee Farmer is a 50 y.o. female.  HPI 50 year old female presents with back and chest pain and dyspnea.  Tells me originally this started today but then later it seems like it has started over the past several days but got worse today.  EMS was called to her house because of dyspnea few days ago and told her her blood pressure was high but she does not know the number.  She used to have hypertension and then due to exercising she took her self off of meds.  Today the pain is primarily her left back but also in her chest which feels like a squeezing.  Right now those pains are much better but she has a little bit of dyspnea and a feeling of not been able to get a full breath in her left lower chest/upper abdomen.  Past Medical History:  Diagnosis Date   Allergy    Anxiety    Depression    Endometriosis of bladder    Heart murmur    IBS (irritable bowel syndrome)     Patient Active Problem List   Diagnosis Date Noted   Hypertensive crisis 08/29/2020   Obesity 08/03/2014   Essential hypertension 08/03/2014    History reviewed. No pertinent surgical history.   OB History   No obstetric history on file.     Family History  Problem Relation Age of Onset   Diabetes Mother    Hyperlipidemia Mother    Hypertension Mother    Diabetes Father    Heart disease Father    Hyperlipidemia Father    Hypertension Father    Hyperlipidemia Brother    Hypertension Brother     Social History   Tobacco Use   Smoking status: Never  Substance Use Topics   Drug use: No    Home Medications Prior to Admission medications   Medication Sig Start Date End Date Taking? Authorizing Provider  fluticasone (FLONASE) 50 MCG/ACT nasal spray Place 2 sprays into both nostrils at bedtime. 01/11/15   Sherren Mocha, MD   pseudoephedrine (SUDAFED) 60 MG tablet Take 60 mg by mouth every 4 (four) hours as needed for congestion.    [provider]    Allergies    Patient has no known allergies.  Review of Systems   Review of Systems  Respiratory:  Positive for shortness of breath.   Cardiovascular:  Positive for chest pain.  Gastrointestinal:  Positive for abdominal pain and nausea.  Musculoskeletal:  Positive for back pain.  Neurological:  Positive for dizziness.  All other systems reviewed and are negative.  Physical Exam Updated Vital Signs BP (!) 159/92   Pulse 95   Temp 98.6 F (37 C) (Oral)   Resp 13   Ht 5\' 1"  (1.549 m)   Wt 127 kg   SpO2 95%   BMI 52.91 kg/m   Physical Exam Vitals and nursing note reviewed.  Constitutional:      General: She is not in acute distress.    Appearance: She is well-developed. She is obese. She is not ill-appearing or diaphoretic.  HENT:     Head: Normocephalic and atraumatic.     Right Ear: External ear normal.     Left Ear: External ear normal.     Nose: Nose normal.  Eyes:  General:        Right eye: No discharge.        Left eye: No discharge.  Cardiovascular:     Rate and Rhythm: Regular rhythm. Tachycardia present.     Heart sounds: Normal heart sounds.  Pulmonary:     Effort: Pulmonary effort is normal.     Breath sounds: Normal breath sounds.  Abdominal:     Palpations: Abdomen is soft.     Tenderness: There is no abdominal tenderness.  Musculoskeletal:     Thoracic back: No tenderness.  Skin:    General: Skin is warm and dry.  Neurological:     Mental Status: She is alert.  Psychiatric:        Mood and Affect: Mood is not anxious.    ED Results / Procedures / Treatments   Labs (all labs ordered are listed, but only abnormal results are displayed) Labs Reviewed  BASIC METABOLIC PANEL - Abnormal; Notable for the following components:      Result Value   Glucose, Bld 100 (*)    All other components within normal  limits  RESP PANEL BY RT-PCR (FLU A&B, COVID) ARPGX2  CBC  PREGNANCY, URINE  TROPONIN I (HIGH SENSITIVITY)  TROPONIN I (HIGH SENSITIVITY)    EKG EKG Interpretation  Date/Time:  Sunday August 29 2020 17:06:24 EDT Ventricular Rate:  105 PR Interval:  172 QRS Duration: 95 QT Interval:  341 QTC Calculation: 451 R Axis:   2 Text Interpretation: Sinus tachycardia no obvious ischemia No old tracing to compare Confirmed by Pricilla Loveless [id number]) on 08/29/2020 5:28:13 PM  Radiology DG Chest 2 View  Result Date: 08/29/2020 CLINICAL DATA:  Chest pain. EXAM: CHEST - 2 VIEW COMPARISON:  June 29, 2009. FINDINGS: Low lung volumes. Borderline enlargement the cardiac silhouette. No consolidation. No visible pleural effusions or pneumothorax. Mild S shaped thoracolumbar curvature. IMPRESSION: 1. Low lung volumes without evidence of acute cardiopulmonary disease. 2. Borderline cardiomegaly. Electronically Signed   By: Feliberto Harts MD   On: 08/29/2020 17:41   CT Angio Chest/Abd/Pel for Dissection W and/or Wo Contrast  Result Date: 08/29/2020 CLINICAL DATA:  Chest and abdominal pain EXAM: CT ANGIOGRAPHY CHEST, ABDOMEN AND PELVIS TECHNIQUE: Non-contrast CT of the chest was initially obtained. Multidetector CT imaging through the chest, abdomen and pelvis was performed using the standard protocol during bolus administration of intravenous contrast. Multiplanar reconstructed images and MIPs were obtained and reviewed to evaluate the vascular anatomy. CONTRAST:  OMNIPAQUE IOHEXOL 350 MG/ML SOLN COMPARISON:  None. FINDINGS: CTA CHEST FINDINGS Cardiovascular: Initial precontrast images demonstrate no evidence of hyperdense crescent to suggest acute aortic injury. Post-contrast images demonstrate the thoracic aorta to be well opacified without evidence of aneurysmal dilatation or dissection. No cardiac enlargement is seen. No coronary calcifications are noted. The pulmonary artery as visualized shows a normal  branching pattern. Small filling defect is noted within the right lower lobe pulmonary arterial branch centrally. This is best visualized on coronal image number 53 of series 8 and axial image number 58 of series 5. No other definitive filling defects are identified to suggest pulmonary embolism. Mediastinum/Nodes: Thoracic inlet is within normal limits. No sizable hilar or mediastinal adenopathy is noted. The esophagus as visualized is within normal limits. Lungs/Pleura: The lungs are well aerated bilaterally. No focal infiltrate or sizable effusion is noted. No parenchymal nodules are seen. Musculoskeletal: Thoracic spine is within normal limits. No acute rib abnormality is noted. Review of the MIP images confirms the above  findings. CTA ABDOMEN AND PELVIS FINDINGS VASCULAR Aorta: Abdominal aorta demonstrates atherosclerotic calcifications without aneurysmal dilatation or dissection. Celiac: Patent without evidence of aneurysm, dissection, vasculitis or significant stenosis. SMA: Patent without evidence of aneurysm, dissection, vasculitis or significant stenosis. Renals: Dual renal arteries are noted on the right. Single renal artery is noted on the left. No focal stenosis is seen. IMA: Patent without evidence of aneurysm, dissection, vasculitis or significant stenosis. Inflow: Patent without evidence of aneurysm, dissection, vasculitis or significant stenosis. Veins: No specific venous abnormality is noted. Review of the MIP images confirms the above findings. NON-VASCULAR Hepatobiliary: Liver demonstrates diffuse decreased attenuation consistent with fatty infiltration. Cholelithiasis is noted without evidence of complicating factors. A small left hepatic cyst is noted. Pancreas: Unremarkable. No pancreatic ductal dilatation or surrounding inflammatory changes. Spleen: Normal in size without focal abnormality. Adrenals/Urinary Tract: Adrenal glands are within normal limits. Kidneys demonstrate a normal  enhancement pattern bilaterally. No renal calculi or obstructive changes are seen. The ureters are within normal limits. Bladder is partially distended. Stomach/Bowel: Appendix is well visualized within normal limits. No obstructive or inflammatory changes of the colon are seen. Small bowel and stomach appear within normal limits. Lymphatic: No sizable lymphadenopathy is noted. Reproductive: Uterus is well visualized and within normal limits. Soft tissue density is noted along the superior aspect of the uterus posteriorly best seen on image number 119 of series 9 and image number 243 of series 5. This measures at least 4.8 cm in greatest dimension. This may represent an exophytic fibroid although the possibility of adnexal lesion could not be totally excluded. Other: No abdominal wall hernia or abnormality. No abdominopelvic ascites. Musculoskeletal: Bilateral pars defects are noted at L5. Mild anterolisthesis is noted of L5 on S1. No other bony abnormality is seen. Review of the MIP images confirms the above findings. IMPRESSION: CTA of the chest: No evidence of aortic dilatation or dissection. Small right lower lobe pulmonary embolus without evidence of right heart strain. CTA of the abdomen and pelvis: No aortic abnormality is identified. Cholelithiasis without complicating factors. Soft tissue density along the posterosuperior aspect of the uterus as described. This could represent an exophytic fibroid posteriorly although the possibility of left adnexal mass would deserve consideration as well. Ultrasound pelvis is recommended for further evaluation and delineation. Electronically Signed   By: Alcide CleverMark  Lukens M.D.   On: 08/29/2020 19:19    Procedures .Critical Care  Date/Time: 08/29/2020 11:10 PM Performed by: Pricilla LovelessGoldston, Kaneesha Constantino, MD Authorized by: Pricilla LovelessGoldston, Veona Bittman, MD   Critical care provider statement:    Critical care time (minutes):  40   Critical care time was exclusive of:  Separately billable procedures  and treating other patients   Critical care was necessary to treat or prevent imminent or life-threatening deterioration of the following conditions:  Cardiac failure   Critical care was time spent personally by me on the following activities:  Discussions with consultants, evaluation of patient's response to treatment, examination of patient, ordering and performing treatments and interventions, ordering and review of laboratory studies, ordering and review of radiographic studies, pulse oximetry, re-evaluation of patient's condition, obtaining history from patient or surrogate and review of old charts   Medications Ordered in ED Medications  nitroGLYCERIN (NITROSTAT) SL tablet 0.4 mg (0.4 mg Sublingual Given 08/29/20 1842)  nicardipine (CARDENE) 20mg  in 0.86% saline 200ml IV infusion (0.1 mg/ml) (7.5 mg/hr Intravenous Rate/Dose Change 08/29/20 2231)  enoxaparin (LOVENOX) injection 126 mg (126 mg Subcutaneous Given 08/29/20 2155)  iohexol (OMNIPAQUE) 350 MG/ML injection  100 mL (100 mLs Intravenous Contrast Given 08/29/20 1823)  labetalol (NORMODYNE) injection 10 mg (10 mg Intravenous Given 08/29/20 1925)  labetalol (NORMODYNE) injection 20 mg (20 mg Intravenous Given 08/29/20 2019)  labetalol (NORMODYNE) injection 20 mg (20 mg Intravenous Given 08/29/20 2113)    ED Course  I have reviewed the triage vital signs and the nursing notes.  Pertinent labs & imaging results that were available during my care of the patient were reviewed by me and considered in my medical decision making (see chart for details).    MDM Rules/Calculators/A&P                           Patient presents with nonspecific chest/back pain as well as a feeling of unable to get a full breath.  Quite hypertensive on arrival and a little tachycardic.  Given the back/chest pain a CTA was obtained to rule out dissection.  No dissection but there appears to be one solitary PE.  She is not hypoxic.  Labetalol has not helped much with the blood  pressure.  Troponins are negative x2.  Overall I think she is having symptomatic hypertension and given the degree I think she will need IV treatments and admission.  Discussed with Dr. Carren Rang who will start on cardene drip.  Advises Lovenox for the PE.  Will admit. Final Clinical Impression(s) / ED Diagnoses Final diagnoses:  Hypertensive crisis    Rx / DC Orders ED Discharge Orders     None        Pricilla Loveless, MD 08/29/20 2311

## 2020-08-30 ENCOUNTER — Inpatient Hospital Stay (HOSPITAL_COMMUNITY): Payer: Self-pay

## 2020-08-30 ENCOUNTER — Encounter (HOSPITAL_COMMUNITY): Payer: Self-pay | Admitting: Family Medicine

## 2020-08-30 DIAGNOSIS — I169 Hypertensive crisis, unspecified: Secondary | ICD-10-CM

## 2020-08-30 DIAGNOSIS — M79605 Pain in left leg: Secondary | ICD-10-CM

## 2020-08-30 MED ORDER — FLEET ENEMA 7-19 GM/118ML RE ENEM: 1.0000 | ENEMA | Freq: Once | RECTAL | Status: DC | PRN

## 2020-08-30 MED ORDER — POLYETHYLENE GLYCOL 3350 17 G PO PACK: 17.0000 g | PACK | Freq: Every day | ORAL | Status: DC | PRN

## 2020-08-30 MED ORDER — ACETAMINOPHEN 325 MG PO TABS
650.0000 mg | ORAL_TABLET | Freq: Four times a day (QID) | ORAL | Status: DC | PRN
  Administered 2020-08-30 – 2020-09-02 (×5): 650 mg via ORAL
  Filled 2020-08-30 (×4): qty 2

## 2020-08-30 MED ORDER — HYDROCODONE-ACETAMINOPHEN 5-325 MG PO TABS: 1.0000 | ORAL_TABLET | ORAL | Status: DC | PRN

## 2020-08-30 MED ORDER — ACETAMINOPHEN 650 MG RE SUPP: 650.0000 mg | Freq: Four times a day (QID) | RECTAL | Status: DC | PRN

## 2020-08-30 MED ORDER — ENOXAPARIN SODIUM 150 MG/ML IJ SOSY
1.0000 mg/kg | PREFILLED_SYRINGE | Freq: Two times a day (BID) | INTRAMUSCULAR | Status: DC
  Administered 2020-08-30 – 2020-09-01 (×6): 126 mg via SUBCUTANEOUS
  Filled 2020-08-30 (×7): qty 0.84

## 2020-08-30 MED ORDER — BISACODYL 5 MG PO TBEC: 5.0000 mg | DELAYED_RELEASE_TABLET | Freq: Every day | ORAL | Status: DC | PRN

## 2020-08-30 MED ORDER — LOSARTAN POTASSIUM 50 MG PO TABS
50.0000 mg | ORAL_TABLET | Freq: Every day | ORAL | Status: DC
  Administered 2020-08-30 – 2020-09-02 (×4): 50 mg via ORAL
  Filled 2020-08-30 (×4): qty 1

## 2020-08-30 MED ORDER — HYDRALAZINE HCL 20 MG/ML IJ SOLN
5.0000 mg | Freq: Four times a day (QID) | INTRAMUSCULAR | Status: DC | PRN
  Administered 2020-08-30: 5 mg via INTRAVENOUS
  Filled 2020-08-30: qty 1

## 2020-08-30 MED ORDER — AMLODIPINE BESYLATE 10 MG PO TABS
10.0000 mg | ORAL_TABLET | Freq: Every day | ORAL | Status: DC
  Administered 2020-08-30 – 2020-09-02 (×4): 10 mg via ORAL
  Filled 2020-08-30: qty 2
  Filled 2020-08-30 (×4): qty 1

## 2020-08-30 MED ORDER — AMLODIPINE BESYLATE 5 MG PO TABS
10.0000 mg | ORAL_TABLET | Freq: Once | ORAL | Status: AC
  Administered 2020-08-30: 10 mg via ORAL

## 2020-08-30 MED ORDER — APIXABAN 5 MG PO TABS: 5.0000 mg | ORAL_TABLET | Freq: Two times a day (BID) | ORAL | Status: DC

## 2020-08-30 MED ORDER — MORPHINE SULFATE (PF) 2 MG/ML IV SOLN
2.0000 mg | INTRAVENOUS | Status: DC | PRN
Start: 2020-08-30 — End: 2020-09-01
  Filled 2020-08-30: qty 1

## 2020-08-30 MED ORDER — ONDANSETRON HCL 4 MG/2ML IJ SOLN: 4.0000 mg | Freq: Four times a day (QID) | INTRAMUSCULAR | Status: DC | PRN

## 2020-08-30 MED ORDER — ACETAMINOPHEN 325 MG PO TABS
650.0000 mg | ORAL_TABLET | Freq: Once | ORAL | Status: AC
  Administered 2020-08-30: 650 mg via ORAL
  Filled 2020-08-30: qty 2

## 2020-08-30 MED ORDER — APIXABAN 5 MG PO TABS
10.0000 mg | ORAL_TABLET | Freq: Two times a day (BID) | ORAL | Status: DC
Start: 2020-08-30 — End: 2020-08-30

## 2020-08-30 MED ORDER — METHOCARBAMOL 1000 MG/10ML IJ SOLN
500.0000 mg | Freq: Four times a day (QID) | INTRAVENOUS | Status: DC | PRN
  Filled 2020-08-30: qty 5

## 2020-08-30 MED ORDER — ONDANSETRON HCL 4 MG PO TABS: 4.0000 mg | ORAL_TABLET | Freq: Four times a day (QID) | ORAL | Status: DC | PRN

## 2020-08-30 NOTE — Plan of Care (Signed)
  Problem: Education: Goal: Knowledge of General Education information will improve Description: Including pain rating scale, medication(s)/side effects and non-pharmacologic comfort measures Outcome: Completed/Met   Problem: Health Behavior/Discharge Planning: Goal: Ability to manage health-related needs will improve Outcome: Completed/Met   Problem: Clinical Measurements: Goal: Ability to maintain clinical measurements within normal limits will improve Outcome: Completed/Met Goal: Will remain free from infection Outcome: Completed/Met Goal: Diagnostic test results will improve Outcome: Completed/Met Goal: Respiratory complications will improve Outcome: Completed/Met Goal: Cardiovascular complication will be avoided Outcome: Completed/Met   Problem: Activity: Goal: Risk for activity intolerance will decrease Outcome: Completed/Met   Problem: Nutrition: Goal: Adequate nutrition will be maintained Outcome: Completed/Met   Problem: Coping: Goal: Level of anxiety will decrease Outcome: Completed/Met   Problem: Elimination: Goal: Will not experience complications related to bowel motility Outcome: Completed/Met Goal: Will not experience complications related to urinary retention Outcome: Completed/Met   Problem: Coping: Goal: Level of anxiety will decrease Outcome: Completed/Met   Problem: Elimination: Goal: Will not experience complications related to bowel motility Outcome: Completed/Met Goal: Will not experience complications related to urinary retention Outcome: Completed/Met   Problem: Pain Managment: Goal: General experience of comfort will improve Outcome: Completed/Met   Problem: Safety: Goal: Ability to remain free from injury will improve Outcome: Completed/Met   Problem: Skin Integrity: Goal: Risk for impaired skin integrity will decrease Outcome: Completed/Met

## 2020-08-30 NOTE — Progress Notes (Addendum)
ANTICOAGULATION CONSULT NOTE - Follow Up Consult  Pharmacy Consult for Apixaban Indication: pulmonary embolus  No Known Allergies  Patient Measurements: Height: 5\' 1"  (154.9 cm) Weight: 127 kg (280 lb) IBW/kg (Calculated) : 47.8   Vital Signs: Temp: 98.6 F (37 C) (08/08 1000) Temp Source: Oral (08/08 1000) BP: 191/91 (08/08 1000) Pulse Rate: 89 (08/08 1000)  Labs: Recent Labs    08/29/20 1703 08/29/20 1903  HGB 13.7  --   HCT 42.5  --   PLT 156  --   CREATININE 0.90  --   TROPONINIHS 5 7    Estimated Creatinine Clearance: 91.8 mL/min (by C-G formula based on SCr of 0.9 mg/dL).   Medications:  Scheduled:   amLODipine  10 mg Oral Daily   losartan  50 mg Oral Daily    Assessment: 50 yo female with new small PE, no anticoagulation PTA. MD wishes to transition Lovenox to a DOAC (apixaban). CBC stable   Goal of Therapy:  Prevention of VTE recoccurance Monitor platelets by anticoagulation protocol: Yes   Plan:  D/C lovenox Give Apixaban 10mg  PO BID x 7 days, then 5mg  BID thereafter.  Monitor CBC and for bleeding Patient will be educated prior to discharge.  Recommend case management consult for apixaban coverage determination.   Anaisha Mago A. 42, PharmD, BCPS, FNKF Clinical Pharmacist [place] Please utilize Amion for appropriate phone number to reach the unit pharmacist [facility])  Addendum:  MD wishes to continue lovenox for now. Apixaban order cancelled (did not receive a dose). Will resume lovenox at prior dose and frequency.   Nashid Pellum A. , PharmD, BCPS, FNKF Clinical Pharmacist [place] Please utilize Amion for appropriate phone number to reach the unit pharmacist [facility])   08/30/2020,10:32 AM

## 2020-08-30 NOTE — Progress Notes (Signed)
Left lower extremity venous duplex has been completed. Preliminary results can be found in CV Proc through chart review.   08/30/20 2:29 PM Olen Cordial RVT

## 2020-08-30 NOTE — H&P (Addendum)
History and Physical    Ashlee Farmer ZOX:[id number] DOB: [birth year] DOA: 08/29/2020  PCP: Daisy Florooss, Charles Alan, MD  Chief Complaint: Chest pain, shortness of breath  HPI: Ashlee DibbleSheila A Farmer is a 50 y.o. female with a past medical history of super morbid obesity, history of hypertension but not on any oral antihypertensives for approximately 10 years, IBS, history of heart murmur diagnosed as a kid and told to take antibiotics before dental visits, GERD.  She does not follow closely with a physician.  States its been many years since she last saw a physician prior to coming to [facility] yesterday.  She started having shortness of breath yesterday morning.  Worsened with laying flat.  Occurred at rest.  No cough, no wheezing.  No fevers or chills.  Also noted some chest tightness and upper abdominal pain.  Located substernal and radiated to the back.  No history of CVA or MI.  No previous history of PE, DVT.  No history of cancer.  He states she only takes Prilosec over-the-counter as needed for acid reflux.  In the past week she started taking aspirin 81 mg daily.  No severe headaches prior to come to the ER.  Once her blood pressure has come down after treatment she is stating she is now having some headaches.  States that she does not follow closely with a doctor because she does not have insurance.  Chest pain is improved. Troponins were cycled and were unremarkable.  CT PE study was obtained to rule out dissection.  Showed a small pulmonary embolism.  The patient was given 3 doses of IV labetalol.  [facility].  EKG showed sinus tachycardia.  Initial SBP was 250 on arrival at [facility].  She states she has a history of heart murmur diagnosed as a kid but cannot explain any further history regarding this.  States that she has been told by a doctor in the past to take antibiotics before dental visits.  She also endorses some left leg cramping.  No history of COPD or asthma.  No  recent prolonged travel.    ED Course: Chest x-ray, CT angio chest, abdomen, pelvis with and without contrast.  EKG, troponins, BNP, CBC, urine pregnancy, flu A/B PCR, COVID-19 PCR.  Tylenol 650 mg p.o. x1, labetalol 10 mg IV x1, labetalol 20 mg IV x2, amlodipine 10 mg p.o. x1.  Review of Systems: 14 point review of systems is negative except for what is mentioned above in the HPI.   Past Medical History:  Diagnosis Date   Allergy    Anxiety    Depression    Endometriosis of bladder    Heart murmur    IBS (irritable bowel syndrome)     History reviewed. No pertinent surgical history.  Social History   Socioeconomic History   Marital status: Divorced    Spouse name: Not on file   Number of children: Not on file   Years of education: Not on file   Highest education level: Not on file  Occupational History   Not on file  Tobacco Use   Smoking status: Never   Smokeless tobacco: Never  Substance and Sexual Activity   Alcohol use: Not on file   Drug use: No   Sexual activity: Not on file  Other Topics Concern   Not on file  Social History Narrative   Not on file   Social Determinants of Health   Financial Resource Strain: Not on file  Food Insecurity: Not on file  Transportation Needs: Not on file  Physical Activity: Not on file  Stress: Not on file  Social Connections: Not on file  Intimate Partner Violence: Not on file    No Known Allergies  Family History  Problem Relation Age of Onset   Diabetes Mother    Hyperlipidemia Mother    Hypertension Mother    Diabetes Father    Heart disease Father    Hyperlipidemia Father    Hypertension Father    Hyperlipidemia Brother    Hypertension Brother     Prior to Admission medications   Not on File    Physical Exam: Vitals:   08/30/20 0700 08/30/20 0730 08/30/20 0801 08/30/20 1000  BP: (!) 177/97 (!) 190/94 (!) 194/97 (!) 191/91  Pulse: 77 86 86 89  Resp: 16 (!) 22 19 18   Temp:    98.6 F (37 C)   TempSrc:    Oral  SpO2: 97% 98% 97% 96%  Weight:      Height:         General:  Appears calm and comfortable and is in NAD Cardiovascular: Tachycardic with regular rhythm, no murmurs. Respiratory:   CTA bilaterally with no wheezes/rales/rhonchi.  Normal respiratory effort. Abdomen:  soft, NT, ND, NABS, morbidly obese abdomen Skin:  no rash or induration seen on limited exam Musculoskeletal:  grossly normal tone BUE/BLE, good ROM, no bony abnormality Lower extremity:  No LE edema.  Limited foot exam with no ulcerations.  2+ distal pulses. Psychiatric:  grossly normal mood and affect, speech fluent and appropriate, AOx3 Neurologic:  CN 2-12 grossly intact, moves all extremities in coordinated fashion, sensation intact    Radiological Exams on Admission: Independently reviewed - see discussion in A/P where applicable  DG Chest 2 View  Result Date: 08/29/2020 CLINICAL DATA:  Chest pain. EXAM: CHEST - 2 VIEW COMPARISON:  June 29, 2009. FINDINGS: Low lung volumes. Borderline enlargement the cardiac silhouette. No consolidation. No visible pleural effusions or pneumothorax. Mild S shaped thoracolumbar curvature. IMPRESSION: 1. Low lung volumes without evidence of acute cardiopulmonary disease. 2. Borderline cardiomegaly. Electronically Signed   By: July 01, 2009 MD   On: 08/29/2020 17:41   CT Angio Chest/Abd/Pel for Dissection W and/or Wo Contrast  Result Date: 08/29/2020 CLINICAL DATA:  Chest and abdominal pain EXAM: CT ANGIOGRAPHY CHEST, ABDOMEN AND PELVIS TECHNIQUE: Non-contrast CT of the chest was initially obtained. Multidetector CT imaging through the chest, abdomen and pelvis was performed using the standard protocol during bolus administration of intravenous contrast. Multiplanar reconstructed images and MIPs were obtained and reviewed to evaluate the vascular anatomy. CONTRAST:  10/29/2020 OMNIPAQUE IOHEXOL 350 MG/ML SOLN COMPARISON:  None. FINDINGS: CTA CHEST FINDINGS Cardiovascular:  Initial precontrast images demonstrate no evidence of hyperdense crescent to suggest acute aortic injury. Post-contrast images demonstrate the thoracic aorta to be well opacified without evidence of aneurysmal dilatation or dissection. No cardiac enlargement is seen. No coronary calcifications are noted. The pulmonary artery as visualized shows a normal branching pattern. Small filling defect is noted within the right lower lobe pulmonary arterial branch centrally. This is best visualized on coronal image number 53 of series 8 and axial image number 58 of series 5. No other definitive filling defects are identified to suggest pulmonary embolism. Mediastinum/Nodes: Thoracic inlet is within normal limits. No sizable hilar or mediastinal adenopathy is noted. The esophagus as visualized is within normal limits. Lungs/Pleura: The lungs are well aerated bilaterally. No focal infiltrate or sizable effusion is  noted. No parenchymal nodules are seen. Musculoskeletal: Thoracic spine is within normal limits. No acute rib abnormality is noted. Review of the MIP images confirms the above findings. CTA ABDOMEN AND PELVIS FINDINGS VASCULAR Aorta: Abdominal aorta demonstrates atherosclerotic calcifications without aneurysmal dilatation or dissection. Celiac: Patent without evidence of aneurysm, dissection, vasculitis or significant stenosis. SMA: Patent without evidence of aneurysm, dissection, vasculitis or significant stenosis. Renals: Dual renal arteries are noted on the right. Single renal artery is noted on the left. No focal stenosis is seen. IMA: Patent without evidence of aneurysm, dissection, vasculitis or significant stenosis. Inflow: Patent without evidence of aneurysm, dissection, vasculitis or significant stenosis. Veins: No specific venous abnormality is noted. Review of the MIP images confirms the above findings. NON-VASCULAR Hepatobiliary: Liver demonstrates diffuse decreased attenuation consistent with fatty  infiltration. Cholelithiasis is noted without evidence of complicating factors. A small left hepatic cyst is noted. Pancreas: Unremarkable. No pancreatic ductal dilatation or surrounding inflammatory changes. Spleen: Normal in size without focal abnormality. Adrenals/Urinary Tract: Adrenal glands are within normal limits. Kidneys demonstrate a normal enhancement pattern bilaterally. No renal calculi or obstructive changes are seen. The ureters are within normal limits. Bladder is partially distended. Stomach/Bowel: Appendix is well visualized within normal limits. No obstructive or inflammatory changes of the colon are seen. Small bowel and stomach appear within normal limits. Lymphatic: No sizable lymphadenopathy is noted. Reproductive: Uterus is well visualized and within normal limits. Soft tissue density is noted along the superior aspect of the uterus posteriorly best seen on image number 119 of series 9 and image number 243 of series 5. This measures at least 4.8 cm in greatest dimension. This may represent an exophytic fibroid although the possibility of adnexal lesion could not be totally excluded. Other: No abdominal wall hernia or abnormality. No abdominopelvic ascites. Musculoskeletal: Bilateral pars defects are noted at L5. Mild anterolisthesis is noted of L5 on S1. No other bony abnormality is seen. Review of the MIP images confirms the above findings. IMPRESSION: CTA of the chest: No evidence of aortic dilatation or dissection. Small right lower lobe pulmonary embolus without evidence of right heart strain. CTA of the abdomen and pelvis: No aortic abnormality is identified. Cholelithiasis without complicating factors. Soft tissue density along the posterosuperior aspect of the uterus as described. This could represent an exophytic fibroid posteriorly although the possibility of left adnexal mass would deserve consideration as well. Ultrasound pelvis is recommended for further evaluation and  delineation. Electronically Signed   By: Alcide Clever M.D.   On: 08/29/2020 19:19    EKG: Independently reviewed.  Sinus tach, rate 105.  No acute ischemic changes.   Labs on Admission: I have personally reviewed the available labs and imaging studies at the time of the admission.  Pertinent labs: None   Assessment/Plan: Nonspecific chest pain likely secondary to acute pulmonary embolism: The patient will be admitted to the medical/surgical floor under inpatient status.  CT PE ruled out dissection.  There was a small noted PE.  No right heart strain noted.  Now on full dose Lovenox.  We will continue this for now.  Can plan on transitioning to oral anticoagulation upon discharge.  Unfortunately the patient does not have insurance at the moment.  We will consult TOC to see if patient will qualify for Medicaid or resources to see if she could afford Eliquis/Xarelto preferably over Coumadin.  EKG showed no acute ischemic changes.  High-sensitivity troponins were unremarkable.  Acute dyspnea likely secondary to above: Plan as above.  The patient notes a history of heart murmurs.  We will obtain echocardiogram.  Hypertensive crisis: Initial systolic blood pressure at [facility] was in the 250s.  Given several doses of IV labetalol and started on amlodipine.  We will continue with amlodipine 10 mg daily.  No renal insufficiency.  Will start Cozaar 50 mg daily.  Titrate blood pressure medications to reach goal blood pressure. SBP now in 190s upon my evaluation.  Left leg cramping: We will obtain a duplex ultrasound to rule out DVT.  Incidental finding of tissue density: CT imaging noted incidental finding of soft tissue density on the posterosuperior aspect of the uterus..  Recommended obtaining a pelvic ultrasound.  Super morbid obesity: Counseled on lifestyle modifications.  Level of Care: MedSurg DVT prophylaxis: Full dose Lovenox Code Status: Full code Consults: None Admission status:  Inpatient   Verdia Kuba DO [facility]   How to contact the [facility] Attending or Consulting provider 7A - 7P or covering provider during after hours 7P -7A, for this patient?  Check the care team in [facility] and look for a) attending/consulting [facility] provider listed and b) the [facility] team listed Log into [email or web address] and use [place]'s universal password to access. If you do not have the password, please contact the hospital operator. Locate the [facility] provider you are looking for under [facility] and page to a number that you can be directly reached. If you still have difficulty reaching the provider, please page the [facility] (Director on Call) for the Hospitalists listed on amion for assistance.   08/30/2020, 11:05 AM

## 2020-08-30 NOTE — ED Notes (Addendum)
Pt family member called this RN to the bedside stating she can not breathe. Pt SP02 is 99 and heartrate is 96 in NSR. Pt states she feels like she can not take a deep breathe and has a cramping like sensation. Pt is diaphoretic in bed with lungs clear Bil in all lobes.

## 2020-08-30 NOTE — ED Notes (Signed)
Patient stated that she felt tensed on her bilateral chest 5/10.

## 2020-08-30 NOTE — ED Notes (Signed)
Attempted to call for report. 

## 2020-08-31 ENCOUNTER — Inpatient Hospital Stay (HOSPITAL_COMMUNITY): Payer: Self-pay

## 2020-08-31 DIAGNOSIS — I2699 Other pulmonary embolism without acute cor pulmonale: Principal | ICD-10-CM

## 2020-08-31 DIAGNOSIS — Z6841 Body Mass Index (BMI) 40.0 and over, adult: Secondary | ICD-10-CM

## 2020-08-31 DIAGNOSIS — R1900 Intra-abdominal and pelvic swelling, mass and lump, unspecified site: Secondary | ICD-10-CM

## 2020-08-31 DIAGNOSIS — R0609 Other forms of dyspnea: Secondary | ICD-10-CM

## 2020-08-31 LAB — LIPID PANEL
Cholesterol: 224 mg/dL — ABNORMAL HIGH (ref 0–200)
HDL: 54 mg/dL (ref >40)
LDL Cholesterol: 146 mg/dL — ABNORMAL HIGH (ref 0–99)
Total CHOL/HDL Ratio: 4.1 RATIO
Triglycerides: 119 mg/dL (ref <150)
VLDL: 24 mg/dL (ref 0–40)

## 2020-08-31 LAB — APTT: aPTT: 45 seconds — ABNORMAL HIGH (ref 24–36)

## 2020-08-31 LAB — BASIC METABOLIC PANEL
Anion gap: 13 (ref 5–15)
BUN: 14 mg/dL (ref 6–20)
CO2: 22 mmol/L (ref 22–32)
Calcium: 9.2 mg/dL (ref 8.9–10.3)
Chloride: 102 mmol/L (ref 98–111)
Creatinine, Ser: 0.95 mg/dL (ref 0.44–1.00)
GFR, Estimated: >60 mL/min (ref >60)
Glucose, Bld: 89 mg/dL (ref 70–99)
Potassium: 3.4 mmol/L — ABNORMAL LOW (ref 3.5–5.1)
Sodium: 137 mmol/L (ref 135–145)

## 2020-08-31 LAB — PROTIME-INR
INR: 1.1 (ref 0.8–1.2)
Prothrombin Time: 13.9 seconds (ref 11.4–15.2)

## 2020-08-31 LAB — CBC
HCT: 43 % (ref 36.0–46.0)
Hemoglobin: 13.6 g/dL (ref 12.0–15.0)
MCH: 28.2 pg (ref 26.0–34.0)
MCHC: 31.6 g/dL (ref 30.0–36.0)
MCV: 89.2 fL (ref 80.0–100.0)
Platelets: 267 10*3/uL (ref 150–400)
RBC: 4.82 MIL/uL (ref 3.87–5.11)
RDW: 14.6 % (ref 11.5–15.5)
WBC: 8.4 10*3/uL (ref 4.0–10.5)
nRBC: 0 % (ref 0.0–0.2)

## 2020-08-31 LAB — HIV ANTIBODY (ROUTINE TESTING W REFLEX): HIV Screen 4th Generation wRfx: NONREACTIVE

## 2020-08-31 LAB — ECHOCARDIOGRAM COMPLETE
Area-P 1/2: 4.15 cm2
Calc EF: 56 %
Height: 61 in
S' Lateral: 3 cm
Single Plane A2C EF: 53.7 %
Single Plane A4C EF: 55 %
Weight: 4480 oz

## 2020-08-31 LAB — HEMOGLOBIN A1C
Hgb A1c MFr Bld: 5.5 % (ref 4.8–5.6)
Mean Plasma Glucose: 111.15 mg/dL

## 2020-08-31 MED ORDER — LORATADINE 10 MG PO TABS: 10.0000 mg | ORAL_TABLET | Freq: Every day | ORAL | Status: DC

## 2020-08-31 MED ORDER — FLUTICASONE PROPIONATE 50 MCG/ACT NA SUSP
2.0000 | Freq: Every day | NASAL | Status: DC
  Filled 2020-08-31: qty 16

## 2020-08-31 MED ORDER — IBUPROFEN 400 MG PO TABS
400.0000 mg | ORAL_TABLET | ORAL | Status: DC | PRN
  Administered 2020-08-31 – 2020-09-01 (×2): 400 mg via ORAL
  Filled 2020-08-31 (×2): qty 1

## 2020-08-31 MED ORDER — LORATADINE 10 MG PO TABS: 10.0000 mg | ORAL_TABLET | Freq: Every day | ORAL | Status: DC | PRN

## 2020-08-31 NOTE — Progress Notes (Signed)
  Echocardiogram 2D Echocardiogram has been performed.  Ashlee Farmer 08/31/2020, 11:15 AM

## 2020-08-31 NOTE — Progress Notes (Signed)
PROGRESS NOTE    Ashlee Farmer  ZHY:[id number] DOB: 11-27-1970 DOA: 08/29/2020 PCP: Daisy Floro, MD    Chief Complaint  Patient presents with   Chest Pain    Brief Narrative:  Ashlee Farmer is a 50 y.o. female with a past medical history of super morbid obesity, history of hypertension but not on any oral antihypertensives for approximately 10 years, IBS, history of heart murmur diagnosed as a kid and told to take antibiotics before dental visits, GERD.  She does not follow closely with a physician.  States its been many years since she last saw a physician prior to coming to [facility] yesterday.  She started having shortness of breath yesterday morning.  Worsened with laying flat.  Occurred at rest.  No cough, no wheezing.  No fevers or chills.  Also noted some chest tightness and upper abdominal pain.  Located substernal and radiated to the back. She is found to have acute PE  Subjective:  Less short of breath, states she never had chest pain, states her back pain has improved Blood pressure better controlled No stomach pain, no bm for several days, she does not want stool softener  Reports Left ear feel muffled, feel drainage at the back of the throat , do not feel like using claritin or mucinex, but will consider flonase  Assessment & Plan:   Active Problems:   Hypertensive crisis   Acute PE -presents with sob -CTA showed "Small right lower lobe pulmonary embolus without evidence of right heart strain" -she is started on therapeutic Lovenox since admission -Venous Doppler no DVT -High-sensitivity troponin negative -Echocardiogram pending -She is on room air at rest -She has no insurance, no PCP, case discussed with case management for medication /pcp assistant  Hypertension emergency  Initial systolic blood pressure at [facility] was in the 250s.  Given several doses of IV labetalol and started on amlodipine and Cozaar Blood pressure is much improved She  had a CTA chest abdomen/pel did not reveal renal artery stenosis, no adrenal lesions identified -Will benefit from weight loss and outpatient sleep study -Plan to discharge on Norvasc and Cozaar -Advised patient check blood pressure at home, follow-up with PCP  Pelvic mass She denies ab /pel pain , reports no period for several years Case discussed with radiology who recommend pelvic MRI with and without contrast Check [id number] She need to follow-up with physician for [facility]  Fatty liver Cholelithiasis without complicating factors Check lipid panel and lft  Morbid obesity Body mass index is 52.91 kg/m. Advised weight loss management         Unresulted Labs (From admission, onward)     Start     Ordered   09/01/20 0500  CA 125  Tomorrow morning,   R        08/31/20 0938   09/01/20 0500  Comprehensive metabolic panel  Tomorrow morning,   R        08/31/20 0938   09/01/20 0500  Magnesium  Tomorrow morning,   R        08/31/20 0938   09/01/20 0500  TSH  Tomorrow morning,   R        08/31/20 0938   08/30/20 1025  Microalbumin / creatinine urine ratio  Once,   R        08/30/20 1025              DVT prophylaxis:   Therapeutic Lovenox   Code  Status: Full Family Communication: Patient Disposition:   Status is: Inpatient   Dispo: The patient is from: Home              Anticipated d/c is to: Home              Anticipated d/c date is: Likely on 8/10 after echocardiogram and pelvic MRI, also need to secure discharge meds/PCP appointment, case manager aware                Consultants:  None  Procedures:  None  Antimicrobials:    Anti-infectives (From admission, onward)    None          Objective: Vitals:   08/30/20 2329 08/31/20 0329 08/31/20 0413 08/31/20 0825  BP: (!) 160/81 127/75 (!) 149/89 (!) 158/95  Pulse: 90 80  83  Resp: 16 20  17   Temp: 98.4 F (36.9 C) 97.9 F (36.6 C)  98 F (36.7 C)  TempSrc: Oral Oral  Oral   SpO2: 95% 94%  96%  Weight:      Height:        Intake/Output Summary (Last 24 hours) at 08/31/2020 0938 Last data filed at 08/30/2020 1804 Gross per 24 hour  Intake 120 ml  Output 600 ml  Net -480 ml   Filed Weights   08/29/20 1701  Weight: 127 kg    Examination:  General exam: calm, NAD Respiratory system: Clear to auscultation. Respiratory effort normal. Cardiovascular system: S1 & S2 heard, RRR. No JVD, no murmur, No pedal edema. Gastrointestinal system: Abdomen is nondistended, soft and nontender. Normal bowel sounds heard. Central nervous system: Alert and oriented. No focal neurological deficits. Extremities: Symmetric 5 x 5 power. Skin: No rashes, lesions or ulcers Psychiatry: Judgement and insight appear normal. Mood & affect appropriate.     Data Reviewed: I have personally reviewed following labs and imaging studies  CBC: Recent Labs  Lab 08/29/20 1703 08/31/20 0011  WBC 8.9 8.4  HGB 13.7 13.6  HCT 42.5 43.0  MCV 87.6 89.2  PLT 156 267    Basic Metabolic Panel: Recent Labs  Lab 08/29/20 1703 08/31/20 0011  NA 138 137  K 4.0 3.4*  CL 99 102  CO2 27 22  GLUCOSE 100* 89  BUN 14 14  CREATININE 0.90 0.95  CALCIUM 9.8 9.2    GFR: Estimated Creatinine Clearance: 86.9 mL/min (by C-G formula based on SCr of 0.95 mg/dL).  Liver Function Tests: No results for input(s): AST, ALT, ALKPHOS, BILITOT, PROT, ALBUMIN in the last 168 hours.  CBG: No results for input(s): GLUCAP in the last 168 hours.   Recent Results (from the past 240 hour(s))  Resp Panel by RT-PCR (Flu A&B, Covid) Nasopharyngeal Swab     Status: None   Collection Time: 08/29/20  7:56 PM   Specimen: Nasopharyngeal Swab; Nasopharyngeal(NP) swabs in vial transport medium  Result Value Ref Range Status   SARS Coronavirus 2 by RT PCR NEGATIVE NEGATIVE Final    Comment: (NOTE) SARS-CoV-2 target nucleic acids are NOT DETECTED.  The SARS-CoV-2 RNA is generally detectable in upper  respiratory specimens during the acute phase of infection. The lowest concentration of SARS-CoV-2 viral copies this assay can detect is 138 copies/mL. A negative result does not preclude SARS-Cov-2 infection and should not be used as the sole basis for treatment or other patient management decisions. A negative result may occur with  improper specimen collection/handling, submission of specimen other than nasopharyngeal swab, presence of viral mutation(s) within  the areas targeted by this assay, and inadequate number of viral copies(<138 copies/mL). A negative result must be combined with clinical observations, patient history, and epidemiological information. The expected result is Negative.  Fact Sheet for Patients:  BloggerCourse.com  Fact Sheet for Healthcare Providers:  SeriousBroker.it  This test is no t yet approved or cleared by the [place] FDA and  has been authorized for detection and/or diagnosis of SARS-CoV-2 by FDA under an Emergency Use Authorization (EUA). This EUA will remain  in effect (meaning this test can be used) for the duration of the COVID-19 declaration under Section 564(b)(1) of the Act, 21 U.S.C.section 360bbb-3(b)(1), unless the authorization is terminated  or revoked sooner.       Influenza A by PCR NEGATIVE NEGATIVE Final   Influenza B by PCR NEGATIVE NEGATIVE Final    Comment: (NOTE) The Xpert Xpress SARS-CoV-2/FLU/RSV plus assay is intended as an aid in the diagnosis of influenza from Nasopharyngeal swab specimens and should not be used as a sole basis for treatment. Nasal washings and aspirates are unacceptable for Xpert Xpress SARS-CoV-2/FLU/RSV testing.  Fact Sheet for Patients: BloggerCourse.com  Fact Sheet for Healthcare Providers: SeriousBroker.it  This test is not yet approved or cleared by the [place] FDA and has been  authorized for detection and/or diagnosis of SARS-CoV-2 by FDA under an Emergency Use Authorization (EUA). This EUA will remain in effect (meaning this test can be used) for the duration of the COVID-19 declaration under Section 564(b)(1) of the Act, 21 U.S.C. section 360bbb-3(b)(1), unless the authorization is terminated or revoked.  Performed at [facility], [address] [place], [place] [address]          Radiology Studies: DG Chest 2 View  Result Date: 08/29/2020 CLINICAL DATA:  Chest pain. EXAM: CHEST - 2 VIEW COMPARISON:  June 29, 2009. FINDINGS: Low lung volumes. Borderline enlargement the cardiac silhouette. No consolidation. No visible pleural effusions or pneumothorax. Mild S shaped thoracolumbar curvature. IMPRESSION: 1. Low lung volumes without evidence of acute cardiopulmonary disease. 2. Borderline cardiomegaly. Electronically Signed   By: Feliberto Harts MD   On: 08/29/2020 17:41   US PELVIC COMPLETE WITH TRANSVAGINAL  Result Date: 08/30/2020 CLINICAL DATA:  Pelvic pain abnormal CT EXAM: TRANSABDOMINAL AND TRANSVAGINAL ULTRASOUND OF PELVIS TECHNIQUE: Both transabdominal and transvaginal ultrasound examinations of the pelvis were performed. Transabdominal technique was performed for global imaging of the pelvis including uterus, ovaries, adnexal regions, and pelvic cul-de-sac. It was necessary to proceed with endovaginal exam following the transabdominal exam to visualize the uterus and adnexa. COMPARISON:  CT 08/29/2020 FINDINGS: Uterus Measurements: 9.2 x 3.8 x 5.5 cm = volume: 101.8 mL. Hypoechoic mass posterior to the lower uterine segment, this measures 4.9 x 3.7 x 4.5 cm. Homogeneous low level internal echoes. No appreciable internal flow. Endometrium Thickness: 4.9 mm.  No focal abnormality visualized. Right ovary Not seen. Left ovary Not seen Other findings No abnormal free fluid. IMPRESSION: 1. 4.9 cm hypoechoic mass posterior to the lower uterine  segment. Ultrasound appearance is indeterminate for exophytic fibroid versus possible endometrioma. Suggest correlation with pelvic MRI. 2. Nonvisualized ovaries. Electronically Signed   By: Jasmine Pang M.D.   On: 08/30/2020 18:11   CT Angio Chest/Abd/Pel for Dissection W and/or Wo Contrast  Result Date: 08/29/2020 CLINICAL DATA:  Chest and abdominal pain EXAM: CT ANGIOGRAPHY CHEST, ABDOMEN AND PELVIS TECHNIQUE: Non-contrast CT of the chest was initially obtained. Multidetector CT imaging through the chest, abdomen and pelvis was performed using  the standard protocol during bolus administration of intravenous contrast. Multiplanar reconstructed images and MIPs were obtained and reviewed to evaluate the vascular anatomy. CONTRAST:  100mL OMNIPAQUE IOHEXOL 350 MG/ML SOLN COMPARISON:  None. FINDINGS: CTA CHEST FINDINGS Cardiovascular: Initial precontrast images demonstrate no evidence of hyperdense crescent to suggest acute aortic injury. Post-contrast images demonstrate the thoracic aorta to be well opacified without evidence of aneurysmal dilatation or dissection. No cardiac enlargement is seen. No coronary calcifications are noted. The pulmonary artery as visualized shows a normal branching pattern. Small filling defect is noted within the right lower lobe pulmonary arterial branch centrally. This is best visualized on coronal image number 53 of series 8 and axial image number 58 of series 5. No other definitive filling defects are identified to suggest pulmonary embolism. Mediastinum/Nodes: Thoracic inlet is within normal limits. No sizable hilar or mediastinal adenopathy is noted. The esophagus as visualized is within normal limits. Lungs/Pleura: The lungs are well aerated bilaterally. No focal infiltrate or sizable effusion is noted. No parenchymal nodules are seen. Musculoskeletal: Thoracic spine is within normal limits. No acute rib abnormality is noted. Review of the MIP images confirms the above  findings. CTA ABDOMEN AND PELVIS FINDINGS VASCULAR Aorta: Abdominal aorta demonstrates atherosclerotic calcifications without aneurysmal dilatation or dissection. Celiac: Patent without evidence of aneurysm, dissection, vasculitis or significant stenosis. SMA: Patent without evidence of aneurysm, dissection, vasculitis or significant stenosis. Renals: Dual renal arteries are noted on the right. Single renal artery is noted on the left. No focal stenosis is seen. IMA: Patent without evidence of aneurysm, dissection, vasculitis or significant stenosis. Inflow: Patent without evidence of aneurysm, dissection, vasculitis or significant stenosis. Veins: No specific venous abnormality is noted. Review of the MIP images confirms the above findings. NON-VASCULAR Hepatobiliary: Liver demonstrates diffuse decreased attenuation consistent with fatty infiltration. Cholelithiasis is noted without evidence of complicating factors. A small left hepatic cyst is noted. Pancreas: Unremarkable. No pancreatic ductal dilatation or surrounding inflammatory changes. Spleen: Normal in size without focal abnormality. Adrenals/Urinary Tract: Adrenal glands are within normal limits. Kidneys demonstrate a normal enhancement pattern bilaterally. No renal calculi or obstructive changes are seen. The ureters are within normal limits. Bladder is partially distended. Stomach/Bowel: Appendix is well visualized within normal limits. No obstructive or inflammatory changes of the colon are seen. Small bowel and stomach appear within normal limits. Lymphatic: No sizable lymphadenopathy is noted. Reproductive: Uterus is well visualized and within normal limits. Soft tissue density is noted along the superior aspect of the uterus posteriorly best seen on image number 119 of series 9 and image number 243 of series 5. This measures at least 4.8 cm in greatest dimension. This may represent an exophytic fibroid although the possibility of adnexal lesion could  not be totally excluded. Other: No abdominal wall hernia or abnormality. No abdominopelvic ascites. Musculoskeletal: Bilateral pars defects are noted at L5. Mild anterolisthesis is noted of L5 on S1. No other bony abnormality is seen. Review of the MIP images confirms the above findings. IMPRESSION: CTA of the chest: No evidence of aortic dilatation or dissection. Small right lower lobe pulmonary embolus without evidence of right heart strain. CTA of the abdomen and pelvis: No aortic abnormality is identified. Cholelithiasis without complicating factors. Soft tissue density along the posterosuperior aspect of the uterus as described. This could represent an exophytic fibroid posteriorly although the possibility of left adnexal mass would deserve consideration as well. Ultrasound pelvis is recommended for further evaluation and delineation. Electronically Signed   By: Loraine LericheMark  Lukens M.D.   On: 08/29/2020 19:19   VAS [place] LOWER EXTREMITY VENOUS (DVT)  Result Date: 08/30/2020  Lower Venous DVT Study Patient Name:  SAUDIA SMYSER  Date of Exam:   08/30/2020 Medical Rec #: [id number]        Accession #:    [id number] Date of Birth: 1970-06-26        Patient Gender: F Patient Age:   52 years Exam Location:  [facility] Procedure:      VAS [place] LOWER EXTREMITY VENOUS (DVT) Referring Phys: Lynwood Dawley --------------------------------------------------------------------------------  Indications: Pain.  Risk Factors: None identified. Limitations: Body habitus and poor ultrasound/tissue interface. Comparison Study: No prior studies. Performing Technologist: Chanda Busing RVT  Examination Guidelines: A complete evaluation includes B-mode imaging, spectral Doppler, color Doppler, and power Doppler as needed of all accessible portions of each vessel. Bilateral testing is considered an integral part of a complete examination. Limited examinations for reoccurring indications may be performed as noted. The reflux portion of the  exam is performed with the patient in reverse Trendelenburg.  +-----+---------------+---------+-----------+----------+--------------+ RIGHTCompressibilityPhasicitySpontaneityPropertiesThrombus Aging +-----+---------------+---------+-----------+----------+--------------+ CFV  Full           Yes      Yes                                 +-----+---------------+---------+-----------+----------+--------------+   +---------+---------------+---------+-----------+----------+--------------+ LEFT     CompressibilityPhasicitySpontaneityPropertiesThrombus Aging +---------+---------------+---------+-----------+----------+--------------+ CFV      Full           Yes      Yes                                 +---------+---------------+---------+-----------+----------+--------------+ SFJ      Full                                                        +---------+---------------+---------+-----------+----------+--------------+ FV Prox  Full                                                        +---------+---------------+---------+-----------+----------+--------------+ FV Mid   Full                                                        +---------+---------------+---------+-----------+----------+--------------+ FV DistalFull                                                        +---------+---------------+---------+-----------+----------+--------------+ PFV      Full                                                        +---------+---------------+---------+-----------+----------+--------------+  POP      Full           Yes      Yes                                 +---------+---------------+---------+-----------+----------+--------------+ PTV      Full                                                        +---------+---------------+---------+-----------+----------+--------------+ PERO     Full                                                         +---------+---------------+---------+-----------+----------+--------------+    Summary: RIGHT: - No evidence of common femoral vein obstruction.  LEFT: - There is no evidence of deep vein thrombosis in the lower extremity.  - No cystic structure found in the popliteal fossa.  *See table(s) above for measurements and observations. Electronically signed by Sherald Hess MD on 08/30/2020 at 4:32:54 PM.    Final         Scheduled Meds:  amLODipine  10 mg Oral Daily   enoxaparin (LOVENOX) injection  1 mg/kg Subcutaneous Q12H   losartan  50 mg Oral Daily   Continuous Infusions:  methocarbamol (ROBAXIN) IV       LOS: 1 day   Time spent: Greater than 50% of this time was spent in counseling, explanation of diagnosis, planning of further management, and coordination of care.   Voice Recognition Reubin Milan dictation system was used to create this note, attempts have been made to correct errors. Please contact the author with questions and/or clarifications.   Albertine Grates, MD PhD FACP [facility]  Available via Epic secure chat 7am-7pm for nonurgent issues Please page for urgent issues To page the attending provider between 7A-7P or the covering provider during after hours 7P-7A, please log into the web site [email or web address] and access using universal [place] password for that web site. If you do not have the password, please call the hospital operator.    08/31/2020, 9:38 AM

## 2020-08-31 NOTE — TOC Initial Note (Addendum)
Transition of Care [facility]) - Initial/Assessment Note    Patient Details  Name: Ashlee Farmer MRN: [id number] Date of Birth: July 28, 1970  [facility] CM/SW Contact:    Durenda Guthrie, RN Phone Number: 08/31/2020, 10:14 AM  Clinical Narrative:  Patient is 50 yr old female admitted with Hypertensive crisis. Case manager spoke with patient concerning medication assistance and need for PCP. Patient states she used to go to [facility], but no longer has employment or insurance, which is why she had no medications and ended up in the hospital.  CM has entered [facility] for patient and meds will be delivered from [facility]. Appointment has been scheduled for Wednesday, September  14,2022 @ 1:30pm at [facility]. Kemper Durie will see if patient can get earlier appointment and will contact patient. CM will provide patient with Eliquis patient assistance form that she will need to complete and return to company.                 1349: [facility] can not see patient earlier than 10/06/20. CM contacted Primary Care at [facility], appointment is scheduled for Thursday, September 23, 2020 at 11:20am with Dr. Andrey Campanile. Appt has been placed on AVS.  1441: CM has requested a financial counselor reach out to patient for Medicaid Screening.  Expected Discharge Plan: Home/Self Care Barriers to Discharge: No Barriers Identified   Patient Goals and CMS Choice     Choice offered to / list presented to : NA  Expected Discharge Plan and Services Expected Discharge Plan: Home/Self Care In-house Referral: PCP / [facility] Discharge Planning Services: [facility], Follow-up appt scheduled, [facility] Acute [facility] Choice: NA Living arrangements for the past 2 months: Apartment                 DME Arranged: N/A DME Agency: NA       HH Arranged: NA HH Agency: NA        Prior Living Arrangements/Services Living arrangements for the past 2 months:  Apartment Lives with:: Self   Do you feel safe going back to the place where you live?: Yes               Activities of Daily Living Home Assistive Devices/Equipment: None ADL Screening (condition at time of admission) Patient's cognitive ability adequate to safely complete daily activities?: Yes Is the patient deaf or have difficulty hearing?: No Does the patient have difficulty seeing, even when wearing glasses/contacts?: No Does the patient have difficulty concentrating, remembering, or making decisions?: No Patient able to express need for assistance with ADLs?: No Does the patient have difficulty dressing or bathing?: No Independently performs ADLs?: Yes (appropriate for developmental age) Does the patient have difficulty walking or climbing stairs?: No Weakness of Legs: None Weakness of Arms/Hands: None  Permission Sought/Granted                  Emotional Assessment       Orientation: : Oriented to Self, Oriented to Place, Oriented to  Time, Oriented to Situation Alcohol / Substance Use: Not Applicable Psych Involvement: No (comment)  Admission diagnosis:  Hypertensive crisis [I16.9] Patient Active Problem List   Diagnosis Date Noted   Hypertensive crisis 08/29/2020   Obesity 08/03/2014   Essential hypertension 08/03/2014   PCP:  Daisy Floro, MD Pharmacy:   [facility] [phone] - [place], [place] - [address] AT [address]NER OF [facility] [address] [place] [address] Phone:  [phone] Fax: [phone]     Social Determinants of Health (SDOH) Interventions    Readmission Risk Interventions No flowsheet data found.

## 2020-09-01 ENCOUNTER — Inpatient Hospital Stay (HOSPITAL_COMMUNITY): Payer: Self-pay

## 2020-09-01 LAB — LIPID PANEL
Cholesterol: 246 mg/dL — ABNORMAL HIGH (ref 0–200)
HDL: 57 mg/dL (ref >40)
LDL Cholesterol: 153 mg/dL — ABNORMAL HIGH (ref 0–99)
Total CHOL/HDL Ratio: 4.3 RATIO
Triglycerides: 178 mg/dL — ABNORMAL HIGH (ref <150)
VLDL: 36 mg/dL (ref 0–40)

## 2020-09-01 LAB — COMPREHENSIVE METABOLIC PANEL
ALT: 24 U/L (ref 0–44)
AST: 23 U/L (ref 15–41)
Albumin: 3.8 g/dL (ref 3.5–5.0)
Alkaline Phosphatase: 72 U/L (ref 38–126)
Anion gap: 9 (ref 5–15)
BUN: 20 mg/dL (ref 6–20)
CO2: 25 mmol/L (ref 22–32)
Calcium: 9.6 mg/dL (ref 8.9–10.3)
Chloride: 102 mmol/L (ref 98–111)
Creatinine, Ser: 1.03 mg/dL — ABNORMAL HIGH (ref 0.44–1.00)
GFR, Estimated: >60 mL/min (ref >60)
Glucose, Bld: 96 mg/dL (ref 70–99)
Potassium: 4 mmol/L (ref 3.5–5.1)
Sodium: 136 mmol/L (ref 135–145)
Total Bilirubin: 1.1 mg/dL (ref 0.3–1.2)
Total Protein: 7.8 g/dL (ref 6.5–8.1)

## 2020-09-01 LAB — TSH: TSH: 2.795 u[IU]/mL (ref 0.350–4.500)

## 2020-09-01 LAB — MAGNESIUM: Magnesium: 2.4 mg/dL (ref 1.7–2.4)

## 2020-09-01 LAB — TROPONIN I (HIGH SENSITIVITY)
Troponin I (High Sensitivity): 6 ng/L (ref <18)
Troponin I (High Sensitivity): 4 ng/L (ref <18)

## 2020-09-01 MED ORDER — SIMVASTATIN 20 MG PO TABS
20.0000 mg | ORAL_TABLET | Freq: Every day | ORAL | Status: DC
  Administered 2020-09-01 – 2020-09-02 (×2): 20 mg via ORAL
  Filled 2020-09-01 (×2): qty 1

## 2020-09-01 MED ORDER — SIMETHICONE 80 MG PO CHEW
80.0000 mg | CHEWABLE_TABLET | Freq: Four times a day (QID) | ORAL | Status: DC | PRN
  Administered 2020-09-01 – 2020-09-02 (×3): 80 mg via ORAL
  Filled 2020-09-01 (×3): qty 1

## 2020-09-01 MED ORDER — HYDRALAZINE HCL 25 MG PO TABS
25.0000 mg | ORAL_TABLET | Freq: Three times a day (TID) | ORAL | Status: DC
  Administered 2020-09-01 – 2020-09-02 (×4): 25 mg via ORAL
  Filled 2020-09-01 (×4): qty 1

## 2020-09-01 MED ORDER — MORPHINE SULFATE (PF) 2 MG/ML IV SOLN: 2.0000 mg | INTRAVENOUS | Status: DC | PRN

## 2020-09-01 MED ORDER — SIMETHICONE 80 MG PO CHEW
80.0000 mg | CHEWABLE_TABLET | Freq: Four times a day (QID) | ORAL | Status: DC | PRN
  Administered 2020-09-01: 80 mg via ORAL
  Filled 2020-09-01: qty 1

## 2020-09-01 MED ORDER — PANTOPRAZOLE SODIUM 40 MG PO TBEC
40.0000 mg | DELAYED_RELEASE_TABLET | Freq: Two times a day (BID) | ORAL | Status: DC
  Administered 2020-09-01 – 2020-09-02 (×3): 40 mg via ORAL
  Filled 2020-09-01 (×3): qty 1

## 2020-09-01 MED ORDER — NITROGLYCERIN 0.4 MG SL SUBL
0.4000 mg | SUBLINGUAL_TABLET | SUBLINGUAL | Status: DC | PRN
  Administered 2020-09-01: 0.4 mg via SUBLINGUAL
  Filled 2020-09-01: qty 1

## 2020-09-01 NOTE — Progress Notes (Signed)
0352- patient complaining of non-radiating pressure in chest  0255- Notified Dr. Arville Care. EKG unremarkable   0256- first dose of nitroglycerin given chest pain of 3/10. Patient placed on 2L [place].   0301- pain at 3/10 second dose of nitro given.  4818- Patient states chest discomfort has "gotten better" but now c/o that she feels like it is hard for her to breathe. Left lung sounds are diminished-unchanged from initial  shift assessment. O2 saturations at 96% on 2L   0315- Patient complaining of gas & belching. Given dose of PRN simethicone.   5909- Patient states that her symptoms are "getting better" after the nitro and simethicone   0330- spoke with Dr. Arville Care over the phone. Troponin ordered.

## 2020-09-01 NOTE — Progress Notes (Signed)
PROGRESS NOTE    Ashlee DibbleSheila A Farmer  ZOX:[id number] DOB: 1970-04-08 DOA: 08/29/2020 PCP: Daisy Florooss, Charles Alan, MD   Brief Narrative: 50 year old with past medical history significant for  morbid obesity, hypertension, not on medication for blood pressure for the last 10 years, IBS, GERD.  She presented with chest pain and shortness of breath at rest.  Also suprasternal chest pressure radiating to the back.\ Patient was found to have PE.  Assessment & Plan:   Active Problems:   Hypertensive crisis  1-Acute PE: -Patient presented with shortness of breath and chest pressure. -CTA showed small right lower lobe pulmonary embolus without evidence of right heart strain. -Echocardiogram: Normal right ventricular function. -Dopplers lower extremity negative for DVT. -troponin  negative. -Plan to transition to Eliquis tomorrow.  Social worker assisting with Eliquis.  2-Hypertensive emergency: Systolic blood pressure in the ED was 250. Patient received several doses of IV labetalol Continue with amlodipine and Cozaar. Patient continued to be elevated, plan to start hydralazine.  3-Chest Pressure, SOB; Suspect related to Pulmonary embolism, Vs GERD Vs Hypertension.  Started on PPI.  ECHO normal EF.  Added Hydralazine for better BP controlled.  Troponin times 2 normal.   4-Pelvic mass: CA125: Pending Will [facility]: Left larger than right ovarian lesions with signal characteristics most consistent with endometriomas.  5-Hyperlipidemia: Plan to start the statins 6-Fatty Liver; needs weight loss.  7-Morbid Obesity; BMI 52/    Estimated body mass index is 52.91 kg/m as calculated from the following:   Height as of this encounter: 5\' 1"  (1.549 m).   Weight as of this encounter: 127 kg.   DVT prophylaxis: Lovenox Code Status: Full Code Family Communication: Care discussed with patient Disposition Plan:  Status is:  Inpatient  Remains inpatient appropriate because:Ongoing active pain requiring inpatient pain management  Dispo: The patient is from: Home              Anticipated d/c is to: Home              Patient currently is not medically stable to d/c.   Difficult to place patient No        Consultants:  None  Procedures:  ECHO; normal EF  Antimicrobials:  None  Subjective: She report chest pressure, SOB. " Feels her blood takes time to circulate around her"  She report reflux and burping.  Report relieves for nitro and simethicone.  .   Objective: Vitals:   09/01/20 0303 09/01/20 0808 09/01/20 0945 09/01/20 1133  BP: (!) 148/82 (!) 156/105 (!) 171/86 (!) 166/98  Pulse: 89 95  93  Resp: 17 18  19   Temp:  97.6 F (36.4 C)  98 F (36.7 C)  TempSrc:  Oral  Oral  SpO2:  90%  94%  Weight:      Height:        Intake/Output Summary (Last 24 hours) at 09/01/2020 1239 Last data filed at 09/01/2020 1233 Gross per 24 hour  Intake 960 ml  Output --  Net 960 ml   Filed Weights   08/29/20 1701  Weight: 127 kg    Examination:  General exam: Appears calm and comfortable  Respiratory system: Clear to auscultation. Respiratory effort normal. Cardiovascular system: S1 & S2 heard, RRR. No JVD, murmurs, rubs, gallops or clicks. No pedal edema. Gastrointestinal system: Abdomen is nondistended, soft and nontender. No organomegaly or masses felt. Normal bowel sounds heard. Central nervous system: Alert and  oriented. Extremities: Symmetric 5 x 5 power. Skin: No rashes, lesions or ulcers    Data Reviewed: I have personally reviewed following labs and imaging studies  CBC: Recent Labs  Lab 08/29/20 1703 08/31/20 0011  WBC 8.9 8.4  HGB 13.7 13.6  HCT 42.5 43.0  MCV 87.6 89.2  PLT 156 267   Basic Metabolic Panel: Recent Labs  Lab 08/29/20 1703 08/31/20 0011 09/01/20 0130  NA 138 137 136  K 4.0 3.4* 4.0  CL 99 102 102  CO2 GLUCOSE 100* 89 96  BUN CREATININE 0.90 0.95 1.03*  CALCIUM 9.8 9.2 9.6  MG  --   --  2.4   GFR: Estimated Creatinine Clearance: 80.2 mL/min (A) (by C-G formula based on SCr of 1.03 mg/dL (H)). Liver Function Tests: Recent Labs  Lab 09/01/20 0130  AST 23  ALT 24  ALKPHOS 72  BILITOT 1.1  PROT 7.8  ALBUMIN 3.8   No results for input(s): LIPASE, AMYLASE in the last 168 hours. No results for input(s): AMMONIA in the last 168 hours. Coagulation Profile: Recent Labs  Lab 08/31/20 0011  INR 1.1   Cardiac Enzymes: No results for input(s): CKTOTAL, CKMB, CKMBINDEX, TROPONINI in the last 168 hours. BNP (last 3 results) No results for input(s): PROBNP in the last 8760 hours. HbA1C: Recent Labs    08/31/20 0011  HGBA1C 5.5   CBG: No results for input(s): GLUCAP in the last 168 hours. Lipid Profile: Recent Labs    08/31/20 0011 09/01/20 0130  CHOL 224* 246*  HDL 54 57  LDLCALC 146* 153*  TRIG 119 178*  CHOLHDL 4.1 4.3   Thyroid Function Tests: Recent Labs    09/01/20 0130  TSH 2.795   Anemia Panel: No results for input(s): VITAMINB12, FOLATE, FERRITIN, TIBC, IRON, RETICCTPCT in the last 72 hours. Sepsis Labs: No results for input(s): PROCALCITON, LATICACIDVEN in the last 168 hours.  Recent Results (from the past 240 hour(s))  Resp Panel by RT-PCR (Flu A&B, Covid) Nasopharyngeal Swab     Status: None   Collection Time: 08/29/20  7:56 PM   Specimen: Nasopharyngeal Swab; Nasopharyngeal(NP) swabs in vial transport medium  Result Value Ref Range Status   SARS Coronavirus 2 by RT PCR NEGATIVE NEGATIVE Final    Comment: (NOTE) SARS-CoV-2 target nucleic acids are NOT DETECTED.  The SARS-CoV-2 RNA is generally detectable in upper respiratory specimens during the acute phase of infection. The lowest concentration of SARS-CoV-2 viral copies this assay can detect is 138 copies/mL. A negative result does not preclude SARS-Cov-2 infection and should not be used as the sole basis for treatment  or other patient management decisions. A negative result may occur with  improper specimen collection/handling, submission of specimen other than nasopharyngeal swab, presence of viral mutation(s) within the areas targeted by this assay, and inadequate number of viral copies(<138 copies/mL). A negative result must be combined with clinical observations, patient history, and epidemiological information. The expected result is Negative.  Fact Sheet for Patients:  BloggerCourse.com  Fact Sheet for Healthcare Providers:  SeriousBroker.it  This test is no t yet approved or cleared by the [place] FDA and  has been authorized for detection and/or diagnosis of SARS-CoV-2 by FDA under an Emergency Use Authorization (EUA). This EUA will remain  in effect (meaning this test can be used) for the duration of the COVID-19 declaration under Section 564(b)(1) of the Act, 21 U.S.C.section 360bbb-3(b)(1), unless the authorization is terminated  or revoked sooner.       Influenza A by PCR NEGATIVE NEGATIVE Final   Influenza B by PCR NEGATIVE NEGATIVE Final    Comment: (NOTE) The Xpert Xpress SARS-CoV-2/FLU/RSV plus assay is intended as an aid in the diagnosis of influenza from Nasopharyngeal swab specimens and should not be used as a sole basis for treatment. Nasal washings and aspirates are unacceptable for Xpert Xpress SARS-CoV-2/FLU/RSV testing.  Fact Sheet for Patients: BloggerCourse.com  Fact Sheet for Healthcare Providers: SeriousBroker.it  This test is not yet approved or cleared by the [place] FDA and has been authorized for detection and/or diagnosis of SARS-CoV-2 by FDA under an Emergency Use Authorization (EUA). This EUA will remain in effect (meaning this test can be used) for the duration of the COVID-19 declaration under Section 564(b)(1) of the Act, 21 U.S.C. section  360bbb-3(b)(1), unless the authorization is terminated or revoked.  Performed at [facility], [address] [place], [place] [address]          Radiology Studies: MR PELVIS WO CONTRAST  Result Date: 09/01/2020 CLINICAL DATA:  Indeterminate left pelvic lesion on CTA and ultrasound EXAM: MRI PELVIS WITHOUT CONTRAST TECHNIQUE: Multiplanar multisequence MR imaging of the pelvis was performed. No intravenous contrast was administered. COMPARISON:  Ultrasound 08/30/2020.  CTA 08/29/2020. FINDINGS: Urinary Tract: No distal hydroureter. Normal noncontrast appearance of the urinary bladder. Bowel: Normal pelvic bowel loops. Vascular/Lymphatic:  No pelvic aneurysm or sidewall adenopathy. Reproductive: Uterus: Measures 5.8 by 9.5 x 4.1 cm (volume = 120 cm^3). Ill definition of the junctional zone. Normal endometrial thickness presuming premenopausal status at 7 mm on 18/7. Tiny nabothian cysts. Right ovary: Identified on 10/03. Exophytic off the posteromedial aspect of the left ovary are 2 lesions which demonstrate T2 hypointensity and T1 hyperintensity. These are immediately adjacent to the uterus, but felt to be ovarian in origin, especially given signal characteristics. Example at 3.5 x 3.9 cm and 2.2 x 1.9 cm on 11/3. T1 hyperintensity identified on 44/900. Left ovary: Primarily normal in appearance including on 11/03. Foci of T2 hypointensity, including within the dependent portion of the ovary on 13/3, corresponding to T1 hyperintensity at 2.0 x 1.4 cm on 49/900. Other: No significant free fluid. Musculoskeletal: Age advanced degenerative disc disease and discogenic edema at L5-S1 with trace anterolisthesis of L5 on S1. Bilateral pars defects are more apparent on prior CT. IMPRESSION: Left larger than right ovarian lesions with signal characteristics most consistent with endometriomas. Electronically Signed   By: Jeronimo Greaves M.D.   On: 09/01/2020 11:08   DG CHEST PORT 1  VIEW  Result Date: 09/01/2020 CLINICAL DATA:  Shortness of breath EXAM: PORTABLE CHEST 1 VIEW COMPARISON:  Portable exam at 0930 hrs compared to 08/29/2020 FINDINGS: Normal heart size, mediastinal contours, and pulmonary vascularity. Lungs clear. No pulmonary infiltrate, pleural effusion, or pneumothorax. Osseous structures unremarkable. IMPRESSION: No acute abnormalities. Electronically Signed   By: Ulyses Southward M.D.   On: 09/01/2020 12:04   ECHOCARDIOGRAM COMPLETE  Result Date: 08/31/2020    ECHOCARDIOGRAM REPORT   Patient Name:   Ashlee Farmer Date of Exam: 08/31/2020 Medical Rec #:  [id number]       Height:       61.0 in Accession #:    [id number]      Weight:       280.0 lb Date of Birth:  [birth year]       BSA:          2.179 m Patient Age:  50 years        BP:           149/89 mmHg Patient Gender: F               HR:           85 bpm. Exam Location:  Inpatient Procedure: 2D Echo, Cardiac Doppler and Color Doppler Indications:    Dyspnea R06.00  History:        Patient has no prior history of Echocardiogram examinations.                 Signs/Symptoms:Murmur; Risk Factors:Non-Smoker.  Sonographer:    Renella Cunas RDCS Referring Phys: [id number] Verdia Kuba IMPRESSIONS  1. Left ventricular ejection fraction, by estimation, is 60 to 65%. The left ventricle has normal function. The left ventricle has no regional wall motion abnormalities. There is mild left ventricular hypertrophy. Left ventricular diastolic parameters are consistent with Grade I diastolic dysfunction (impaired relaxation).  2. Right ventricular systolic function is normal. The right ventricular size is normal.  3. The mitral valve is normal in structure. No evidence of mitral valve regurgitation. No evidence of mitral stenosis.  4. The aortic valve is calcified. Aortic valve regurgitation is mild. Mild aortic valve sclerosis is present, with no evidence of aortic valve stenosis.  5. The inferior vena cava is normal in size with greater than  50% respiratory variability, suggesting right atrial pressure of 3 mmHg. FINDINGS  Left Ventricle: Left ventricular ejection fraction, by estimation, is 60 to 65%. The left ventricle has normal function. The left ventricle has no regional wall motion abnormalities. The left ventricular internal cavity size was normal in size. There is  mild left ventricular hypertrophy. Left ventricular diastolic parameters are consistent with Grade I diastolic dysfunction (impaired relaxation). Right Ventricle: The right ventricular size is normal. No increase in right ventricular wall thickness. Right ventricular systolic function is normal. Left Atrium: Left atrial size was normal in size. Right Atrium: Right atrial size was normal in size. Pericardium: There is no evidence of pericardial effusion. Mitral Valve: The mitral valve is normal in structure. No evidence of mitral valve regurgitation. No evidence of mitral valve stenosis. Tricuspid Valve: The tricuspid valve is normal in structure. Tricuspid valve regurgitation is not demonstrated. No evidence of tricuspid stenosis. Aortic Valve: The aortic valve is calcified. Aortic valve regurgitation is mild. Mild aortic valve sclerosis is present, with no evidence of aortic valve stenosis. Pulmonic Valve: The pulmonic valve was normal in structure. Pulmonic valve regurgitation is not visualized. No evidence of pulmonic stenosis. Aorta: The aortic root is normal in size and structure. Venous: The inferior vena cava is normal in size with greater than 50% respiratory variability, suggesting right atrial pressure of 3 mmHg. IAS/Shunts: No atrial level shunt detected by color flow Doppler.  LEFT VENTRICLE PLAX 2D LVIDd:         4.10 cm      Diastology LVIDs:         3.00 cm      LV e' medial:    4.54 cm/s LV PW:         1.30 cm      LV E/e' medial:  15.9 LV IVS:        1.30 cm      LV e' lateral:   6.81 cm/s LVOT diam:     2.10 cm      LV E/e' lateral: 10.6 LV SV:  81 LV SV  Index:   37 LVOT Area:     3.46 cm  LV Volumes (MOD) LV vol d, MOD A2C: 100.0 ml LV vol d, MOD A4C: 119.0 ml LV vol s, MOD A2C: 46.3 ml LV vol s, MOD A4C: 53.6 ml LV SV MOD A2C:     53.7 ml LV SV MOD A4C:     119.0 ml LV SV MOD BP:      63.4 ml RIGHT VENTRICLE RV S prime:     11.70 cm/s TAPSE (M-mode): 2.0 cm LEFT ATRIUM             Index       RIGHT ATRIUM           Index LA diam:        4.70 cm 2.16 cm/m  RA Area:     14.20 cm LA Vol (A2C):   28.2 ml 12.94 ml/m RA Volume:   35.70 ml  16.38 ml/m LA Vol (A4C):   27.4 ml 12.57 ml/m LA Biplane Vol: 29.8 ml 13.68 ml/m  AORTIC VALVE LVOT Vmax:   129.00 cm/s LVOT Vmean:  89.200 cm/s LVOT VTI:    0.234 m  AORTA Ao Root diam: 3.00 cm Ao Asc diam:  3.30 cm MITRAL VALVE MV Area (PHT): 4.15 cm     SHUNTS MV Decel Time: 183 msec     Systemic VTI:  0.23 m MV E velocity: 72.30 cm/s   Systemic Diam: 2.10 cm MV A velocity: 102.00 cm/s MV E/A ratio:  0.71 Donato Schultz MD Electronically signed by Donato Schultz MD Signature Date/Time: 08/31/2020/1:40:48 PM    Final    US PELVIC COMPLETE WITH TRANSVAGINAL  Result Date: 08/30/2020 CLINICAL DATA:  Pelvic pain abnormal CT EXAM: TRANSABDOMINAL AND TRANSVAGINAL ULTRASOUND OF PELVIS TECHNIQUE: Both transabdominal and transvaginal ultrasound examinations of the pelvis were performed. Transabdominal technique was performed for global imaging of the pelvis including uterus, ovaries, adnexal regions, and pelvic cul-de-sac. It was necessary to proceed with endovaginal exam following the transabdominal exam to visualize the uterus and adnexa. COMPARISON:  CT 08/29/2020 FINDINGS: Uterus Measurements: 9.2 x 3.8 x 5.5 cm = volume: 101.8 mL. Hypoechoic mass posterior to the lower uterine segment, this measures 4.9 x 3.7 x 4.5 cm. Homogeneous low level internal echoes. No appreciable internal flow. Endometrium Thickness: 4.9 mm.  No focal abnormality visualized. Right ovary Not seen. Left ovary Not seen Other findings No abnormal free fluid.  IMPRESSION: 1. 4.9 cm hypoechoic mass posterior to the lower uterine segment. Ultrasound appearance is indeterminate for exophytic fibroid versus possible endometrioma. Suggest correlation with pelvic MRI. 2. Nonvisualized ovaries. Electronically Signed   By: Jasmine Pang M.D.   On: 08/30/2020 18:11   VAS [place] LOWER EXTREMITY VENOUS (DVT)  Result Date: 08/30/2020  Lower Venous DVT Study Patient Name:  RATEEL BELDIN  Date of Exam:   08/30/2020 Medical Rec #: [id number]        Accession #:    [id number] Date of Birth: Jul 03, 1970        Patient Gender: F Patient Age:   75 years Exam Location:  [facility] Procedure:      VAS [place] LOWER EXTREMITY VENOUS (DVT) Referring Phys: Lynwood Dawley --------------------------------------------------------------------------------  Indications: Pain.  Risk Factors: None identified. Limitations: Body habitus and poor ultrasound/tissue interface. Comparison Study: No prior studies. Performing Technologist: Chanda Busing RVT  Examination Guidelines: A complete evaluation includes B-mode imaging, spectral Doppler, color Doppler, and power Doppler as needed of  all accessible portions of each vessel. Bilateral testing is considered an integral part of a complete examination. Limited examinations for reoccurring indications may be performed as noted. The reflux portion of the exam is performed with the patient in reverse Trendelenburg.  +-----+---------------+---------+-----------+----------+--------------+ RIGHTCompressibilityPhasicitySpontaneityPropertiesThrombus Aging +-----+---------------+---------+-----------+----------+--------------+ CFV  Full           Yes      Yes                                 +-----+---------------+---------+-----------+----------+--------------+   +---------+---------------+---------+-----------+----------+--------------+ LEFT     CompressibilityPhasicitySpontaneityPropertiesThrombus Aging  +---------+---------------+---------+-----------+----------+--------------+ CFV      Full           Yes      Yes                                 +---------+---------------+---------+-----------+----------+--------------+ SFJ      Full                                                        +---------+---------------+---------+-----------+----------+--------------+ FV Prox  Full                                                        +---------+---------------+---------+-----------+----------+--------------+ FV Mid   Full                                                        +---------+---------------+---------+-----------+----------+--------------+ FV DistalFull                                                        +---------+---------------+---------+-----------+----------+--------------+ PFV      Full                                                        +---------+---------------+---------+-----------+----------+--------------+ POP      Full           Yes      Yes                                 +---------+---------------+---------+-----------+----------+--------------+ PTV      Full                                                        +---------+---------------+---------+-----------+----------+--------------+ PERO     Full                                                        +---------+---------------+---------+-----------+----------+--------------+  Summary: RIGHT: - No evidence of common femoral vein obstruction.  LEFT: - There is no evidence of deep vein thrombosis in the lower extremity.  - No cystic structure found in the popliteal fossa.  *See table(s) above for measurements and observations. Electronically signed by Sherald Hess MD on 08/30/2020 at 4:32:54 PM.    Final         Scheduled Meds:  amLODipine  10 mg Oral Daily   enoxaparin (LOVENOX) injection  1 mg/kg Subcutaneous Q12H   fluticasone  2 spray Each Nare Daily    hydrALAZINE  25 mg Oral Q8H   losartan  50 mg Oral Daily   pantoprazole  40 mg Oral BID   Continuous Infusions:  methocarbamol (ROBAXIN) IV       LOS: 2 days    Time spent: 35 minutes    Tommye Lehenbauer A Tabor Denham, MD [facility]   If 7PM-7AM, please contact night-coverage [email or web address]  09/01/2020, 12:39 PM

## 2020-09-01 NOTE — Progress Notes (Signed)
Pt returned from MRI, VS obtained. Pt stated her "nerves are shot" and "chest feels tight" had this same sort of episode last night. EKG and troponins ordered 09-01-20 night shift. Pt requesting nitro this morning stating it "calmed her down" Charge RN aware. Paged Regalado MD via Amion. Call back received [phone] stated to try prn morphine. Morphine taken into room pt refused medication. MD aware via secure chat. MD to bedside 0900. Nitro & protonix given per order. Upon reassessment pt is improving.

## 2020-09-02 ENCOUNTER — Other Ambulatory Visit (HOSPITAL_COMMUNITY): Payer: Self-pay

## 2020-09-02 LAB — CA 125: Cancer Antigen (CA) 125: 18.3 U/mL (ref 0.0–38.1)

## 2020-09-02 MED ORDER — PANTOPRAZOLE SODIUM 40 MG PO TBEC
40.0000 mg | DELAYED_RELEASE_TABLET | Freq: Two times a day (BID) | ORAL | 0 refills | Status: DC
  Filled 2020-09-02: qty 60, 30d supply, fill #0

## 2020-09-02 MED ORDER — APIXABAN 5 MG PO TABS: 5.0000 mg | ORAL_TABLET | Freq: Two times a day (BID) | ORAL | Status: DC

## 2020-09-02 MED ORDER — LOSARTAN POTASSIUM 50 MG PO TABS
50.0000 mg | ORAL_TABLET | Freq: Every day | ORAL | 3 refills | Status: DC
  Filled 2020-09-02: qty 30, 30d supply, fill #0
  Filled 2020-09-30: qty 30, 30d supply, fill #1

## 2020-09-02 MED ORDER — APIXABAN 5 MG PO TABS
5.0000 mg | ORAL_TABLET | Freq: Two times a day (BID) | ORAL | 0 refills | Status: DC
  Filled 2020-09-02: qty 60, 30d supply, fill #0

## 2020-09-02 MED ORDER — AMLODIPINE BESYLATE 10 MG PO TABS
10.0000 mg | ORAL_TABLET | Freq: Every day | ORAL | 3 refills | Status: DC
  Filled 2020-09-02: qty 30, 30d supply, fill #0
  Filled 2020-09-30: qty 30, 30d supply, fill #1
  Filled 2020-11-02: qty 30, 30d supply, fill #2
  Filled 2020-11-29: qty 30, 30d supply, fill #3

## 2020-09-02 MED ORDER — APIXABAN (ELIQUIS) VTE STARTER PACK (10MG AND 5MG)
ORAL_TABLET | Freq: Two times a day (BID) | ORAL | 0 refills | Status: DC
  Filled 2020-09-02: qty 74, 30d supply, fill #0

## 2020-09-02 MED ORDER — HYDRALAZINE HCL 25 MG PO TABS
25.0000 mg | ORAL_TABLET | Freq: Three times a day (TID) | ORAL | 3 refills | Status: DC
  Filled 2020-09-02: qty 90, 30d supply, fill #0
  Filled 2020-09-30: qty 90, 30d supply, fill #1
  Filled 2020-11-08: qty 90, 30d supply, fill #2
  Filled 2020-12-10: qty 90, 30d supply, fill #0

## 2020-09-02 MED ORDER — APIXABAN 5 MG PO TABS
10.0000 mg | ORAL_TABLET | Freq: Two times a day (BID) | ORAL | Status: DC
  Administered 2020-09-02: 10 mg via ORAL
  Filled 2020-09-02: qty 2

## 2020-09-02 MED ORDER — SIMVASTATIN 20 MG PO TABS
20.0000 mg | ORAL_TABLET | Freq: Every day | ORAL | 1 refills | Status: DC
  Filled 2020-09-02: qty 30, 30d supply, fill #0
  Filled 2020-09-30: qty 30, 30d supply, fill #1

## 2020-09-02 NOTE — Discharge Instructions (Signed)
Information on my medicine - ELIQUIS (apixaban)   Why was Eliquis prescribed for you? Eliquis was prescribed to treat blood clots that may have been found in the veins of your legs (deep vein thrombosis) or in your lungs (pulmonary embolism) and to reduce the risk of them occurring again.  What do You need to know about Eliquis ? The starting dose is 10 mg (two 5 mg tablets) taken TWICE daily for the FIRST SEVEN (7) DAYS, then on 09/09/2020  the dose is reduced to ONE 5 mg tablet taken TWICE daily.  Eliquis may be taken with or without food.   Try to take the dose about the same time in the morning and in the evening. If you have difficulty swallowing the tablet whole please discuss with your pharmacist how to take the medication safely.  Take Eliquis exactly as prescribed and DO NOT stop taking Eliquis without talking to the doctor who prescribed the medication.  Stopping may increase your risk of developing a new blood clot.  Refill your prescription before you run out.  After discharge, you should have regular check-up appointments with your healthcare provider that is prescribing your Eliquis.    What do you do if you miss a dose? If a dose of ELIQUIS is not taken at the scheduled time, take it as soon as possible on the same day and twice-daily administration should be resumed. The dose should not be doubled to make up for a missed dose.  Important Safety Information A possible side effect of Eliquis is bleeding. You should call your healthcare provider right away if you experience any of the following: Bleeding from an injury or your nose that does not stop. Unusual colored urine (red or dark brown) or unusual colored stools (red or black). Unusual bruising for unknown reasons. A serious fall or if you hit your head (even if there is no bleeding).  Some medicines may interact with Eliquis and might increase your risk of bleeding or clotting while on Eliquis. To help avoid  this, consult your healthcare provider or pharmacist prior to using any new prescription or non-prescription medications, including herbals, vitamins, non-steroidal anti-inflammatory drugs (NSAIDs) and supplements.  This website has more information on Eliquis (apixaban): http://www.eliquis.com/eliquis/home

## 2020-09-02 NOTE — TOC Transition Note (Signed)
Transition of Care [facility]) - CM/SW Discharge Note   Patient Details  Name: Ashlee Farmer MRN: [id number] Date of Birth: 1970-08-06  Transition of Care [facility]) CM/SW Contact:  Kermit Balo, RN Phone Number: 09/02/2020, 1:32 PM   Clinical Narrative:    Patient discharging home with self care. Pt has appt with Primary Care at [facility] with information on the AVS. Information for [facility] pharmacy also on AVS. CM has sent email to financial counseling to f/u on Medicaid.  Medications for d/c covered with MATCH and delivered to the room per [facility] pharmacy.  Pt has transportation home.   Final next level of care: Home/Self Care Barriers to Discharge: Barriers Unresolved (comment)   Patient Goals and CMS Choice     Choice offered to / list presented to : NA  Discharge Placement                       Discharge Plan and Services In-house Referral: PCP / [facility] Discharge Planning Services: [facility] Program, Follow-up appt scheduled, [facility] Post Acute Care Choice: NA          DME Arranged: N/A DME Agency: NA       HH Arranged: NA HH Agency: NA        Social Determinants of Health (SDOH) Interventions     Readmission Risk Interventions No flowsheet data found.

## 2020-09-02 NOTE — Discharge Summary (Signed)
Physician Discharge Summary  GALI SPINNEY GNF:[id number] DOB: 03-Aug-1970 DOA: 08/29/2020  PCP: Daisy Floro, MD  Admit date: 08/29/2020 Discharge date: 09/02/2020  Admitted From: Home  Disposition:  Home   Recommendations for Outpatient Follow-up:  Follow up with PCP in 1-2 weeks Please obtain BMP/CBC in one week Follow up BP, adjust medication as need.  She will need follow up with GYN for ovarian mass, question endometrioma.  Needs 6 months of anticoagulation for PE.   Home Health: None  Discharge Condition: Stable.  CODE STATUS: Full code Diet recommendation: Heart Healthy   Brief/Interim Summary: 50 year old with past medical history significant for  morbid obesity, hypertension, not on medication for blood pressure for the last 10 years, IBS, GERD.  She presented with chest pain and shortness of breath at rest.  Also suprasternal chest pressure radiating to the back.\ Patient was found to have PE.    1-Acute PE: -Patient presented with shortness of breath and chest pressure. -CTA showed small right lower lobe pulmonary embolus without evidence of right heart strain. -Echocardiogram: Normal right ventricular function. -Dopplers lower extremity negative for DVT. -troponin  negative. -Plan to transition to Eliquis today. Discharge home today.   Social worker assisting with Eliquis.   2-Hypertensive emergency: Systolic blood pressure in the ED was 250. Patient received several doses of IV labetalol Continue with amlodipine and Cozaar and hydralazine.  BP improved with Hydralazine.    3-Chest Pressure, SOB; Suspect related to Pulmonary embolism, Vs GERD Vs Hypertension.  Started on PPI.  ECHO normal EF.  Added Hydralazine for better BP controlled.  Troponin times 2 normal.    4-Pelvic Mass: CA125: P18 Will need [facility] MRI pelvis: Left larger than right ovarian lesions with signal characteristics most consistent  with endometriomas.   5-Hyperlipidemia: Started statins 6-Fatty Liver; needs weight loss.  7-Morbid Obesity; BMI 52/     Discharge Diagnoses:  Active Problems:   Hypertensive crisis    Discharge Instructions  Discharge Instructions     Diet - low sodium heart healthy   Complete by: As directed    Increase activity slowly   Complete by: As directed       Allergies as of 09/02/2020   No Known Allergies      Medication List     TAKE these medications    amLODipine 10 MG tablet Commonly known as: NORVASC Take 1 tablet (10 mg total) by mouth daily. Start taking on: September 03, 2020   Eliquis DVT/PE Starter Pack Generic drug: Apixaban Starter Pack (  and ) Take 2 tablets (  total) by mouth 2 (two) times daily for 7 days, then take 1 tablet twice daily.   Eliquis 5 MG Tabs tablet Generic drug: apixaban Begin taking when done with the starter pack.  Take 1 tablet (5 mg total) by mouth 2 (two) times daily. Start taking on: September 09, 2020   hydrALAZINE 25 MG tablet Commonly known as: APRESOLINE Take 1 tablet (25 mg total) by mouth every 8 (eight) hours.   losartan 50 MG tablet Commonly known as: COZAAR Take 1 tablet (50 mg total) by mouth daily. Start taking on: September 03, 2020   pantoprazole 40 MG tablet Commonly known as: PROTONIX Take 1 tablet (40 mg total) by mouth 2 (two) times daily.   simvastatin 20 MG tablet Commonly known as: ZOCOR Take 1 tablet (20 mg total) by mouth daily at 6 PM.  Follow-up Information     [facility] Follow up in 1 month(s).   Why: for pelvic mass Contact information: [address] [place] [address] [phone]         [facility]. Go to.   Why: Hospital follow-up/ establish PCP your appointment is scheduled for Thursday, September 23, 2020 @ 11:20am with Dr. Andrey Campanile  Please inquire about application for the [facility]  information: [address] [place] [address]        [facility] Follow up.   Why: Please use this location for your pharmacy needs.  Ask about the application for the [facility] card (meds). Contact information: [address] [place] [address] [phone]               No Known Allergies  Consultations: None   Procedures/Studies: DG Chest 2 View  Result Date: 08/29/2020 CLINICAL DATA:  Chest pain. EXAM: CHEST - 2 VIEW COMPARISON:  June 29, 2009. FINDINGS: Low lung volumes. Borderline enlargement the cardiac silhouette. No consolidation. No visible pleural effusions or pneumothorax. Mild S shaped thoracolumbar curvature. IMPRESSION: 1. Low lung volumes without evidence of acute cardiopulmonary disease. 2. Borderline cardiomegaly. Electronically Signed   By: Feliberto Harts MD   On: 08/29/2020 17:41   MR PELVIS WO CONTRAST  Result Date: 09/01/2020 CLINICAL DATA:  Indeterminate left pelvic lesion on CTA and ultrasound EXAM: MRI PELVIS WITHOUT CONTRAST TECHNIQUE: Multiplanar multisequence MR imaging of the pelvis was performed. No intravenous contrast was administered. COMPARISON:  Ultrasound 08/30/2020.  CTA 08/29/2020. FINDINGS: Urinary Tract: No distal hydroureter. Normal noncontrast appearance of the urinary bladder. Bowel: Normal pelvic bowel loops. Vascular/Lymphatic:  No pelvic aneurysm or sidewall adenopathy. Reproductive: Uterus: Measures 5.8 by 9.5 x 4.1 cm (volume = 120 cm^3). Ill definition of the junctional zone. Normal endometrial thickness presuming premenopausal status at 7 mm on 18/7. Tiny nabothian cysts. Right ovary: Identified on 10/03. Exophytic off the posteromedial aspect of the left ovary are 2 lesions which demonstrate T2 hypointensity and T1 hyperintensity. These are immediately adjacent to the uterus, but felt to be ovarian in origin, especially given signal characteristics.  Example at 3.5 x 3.9 cm and 2.2 x 1.9 cm on 11/3. T1 hyperintensity identified on 44/900. Left ovary: Primarily normal in appearance including on 11/03. Foci of T2 hypointensity, including within the dependent portion of the ovary on 13/3, corresponding to T1 hyperintensity at 2.0 x 1.4 cm on 49/900. Other: No significant free fluid. Musculoskeletal: Age advanced degenerative disc disease and discogenic edema at L5-S1 with trace anterolisthesis of L5 on S1. Bilateral pars defects are more apparent on prior CT. IMPRESSION: Left larger than right ovarian lesions with signal characteristics most consistent with endometriomas. Electronically Signed   By: Jeronimo Greaves M.D.   On: 09/01/2020 11:08   DG CHEST PORT 1 VIEW  Result Date: 09/01/2020 CLINICAL DATA:  Shortness of breath EXAM: PORTABLE CHEST 1 VIEW COMPARISON:  Portable exam at 0930 hrs compared to 08/29/2020 FINDINGS: Normal heart size, mediastinal contours, and pulmonary vascularity. Lungs clear. No pulmonary infiltrate, pleural effusion, or pneumothorax. Osseous structures unremarkable. IMPRESSION: No acute abnormalities. Electronically Signed   By: Ulyses Southward M.D.   On: 09/01/2020 12:04   ECHOCARDIOGRAM COMPLETE  Result Date: 08/31/2020    ECHOCARDIOGRAM REPORT   Patient Name:   Lorna Dibble Date of Exam: 08/31/2020 Medical Rec #:  [id number]  Height:       61.0 in Accession #:    [id number]      Weight:       280.0 lb Date of Birth:  Feb 25, 1970       BSA:          2.179 m Patient Age:    50 years        BP:           149/89 mmHg Patient Gender: F               HR:           85 bpm. Exam Location:  Inpatient Procedure: 2D Echo, Cardiac Doppler and Color Doppler Indications:    Dyspnea R06.00  History:        Patient has no prior history of Echocardiogram examinations.                 Signs/Symptoms:Murmur; Risk Factors:Non-Smoker.  Sonographer:    Renella Cunas RDCS Referring Phys: [id number] Verdia Kuba IMPRESSIONS  1. Left ventricular ejection  fraction, by estimation, is 60 to 65%. The left ventricle has normal function. The left ventricle has no regional wall motion abnormalities. There is mild left ventricular hypertrophy. Left ventricular diastolic parameters are consistent with Grade I diastolic dysfunction (impaired relaxation).  2. Right ventricular systolic function is normal. The right ventricular size is normal.  3. The mitral valve is normal in structure. No evidence of mitral valve regurgitation. No evidence of mitral stenosis.  4. The aortic valve is calcified. Aortic valve regurgitation is mild. Mild aortic valve sclerosis is present, with no evidence of aortic valve stenosis.  5. The inferior vena cava is normal in size with greater than 50% respiratory variability, suggesting right atrial pressure of 3 mmHg. FINDINGS  Left Ventricle: Left ventricular ejection fraction, by estimation, is 60 to 65%. The left ventricle has normal function. The left ventricle has no regional wall motion abnormalities. The left ventricular internal cavity size was normal in size. There is  mild left ventricular hypertrophy. Left ventricular diastolic parameters are consistent with Grade I diastolic dysfunction (impaired relaxation). Right Ventricle: The right ventricular size is normal. No increase in right ventricular wall thickness. Right ventricular systolic function is normal. Left Atrium: Left atrial size was normal in size. Right Atrium: Right atrial size was normal in size. Pericardium: There is no evidence of pericardial effusion. Mitral Valve: The mitral valve is normal in structure. No evidence of mitral valve regurgitation. No evidence of mitral valve stenosis. Tricuspid Valve: The tricuspid valve is normal in structure. Tricuspid valve regurgitation is not demonstrated. No evidence of tricuspid stenosis. Aortic Valve: The aortic valve is calcified. Aortic valve regurgitation is mild. Mild aortic valve sclerosis is present, with no evidence of aortic  valve stenosis. Pulmonic Valve: The pulmonic valve was normal in structure. Pulmonic valve regurgitation is not visualized. No evidence of pulmonic stenosis. Aorta: The aortic root is normal in size and structure. Venous: The inferior vena cava is normal in size with greater than 50% respiratory variability, suggesting right atrial pressure of 3 mmHg. IAS/Shunts: No atrial level shunt detected by color flow Doppler.  LEFT VENTRICLE PLAX 2D LVIDd:         4.10 cm      Diastology LVIDs:         3.00 cm      LV e' medial:    4.54 cm/s LV PW:         1.30 cm  LV E/e' medial:  15.9 LV IVS:        1.30 cm      LV e' lateral:   6.81 cm/s LVOT diam:     2.10 cm      LV E/e' lateral: 10.6 LV SV:         81 LV SV Index:   37 LVOT Area:     3.46 cm  LV Volumes (MOD) LV vol d, MOD A2C: 100.0 ml LV vol d, MOD A4C: 119.0 ml LV vol s, MOD A2C: 46.3 ml LV vol s, MOD A4C: 53.6 ml LV SV MOD A2C:     53.7 ml LV SV MOD A4C:     119.0 ml LV SV MOD BP:      63.4 ml RIGHT VENTRICLE RV S prime:     11.70 cm/s TAPSE (M-mode): 2.0 cm LEFT ATRIUM             Index       RIGHT ATRIUM           Index LA diam:        4.70 cm 2.16 cm/m  RA Area:     14.20 cm LA Vol (A2C):   28.2 ml 12.94 ml/m RA Volume:   35.70 ml  16.38 ml/m LA Vol (A4C):   27.4 ml 12.57 ml/m LA Biplane Vol: 29.8 ml 13.68 ml/m  AORTIC VALVE LVOT Vmax:   129.00 cm/s LVOT Vmean:  89.200 cm/s LVOT VTI:    0.234 m  AORTA Ao Root diam: 3.00 cm Ao Asc diam:  3.30 cm MITRAL VALVE MV Area (PHT): 4.15 cm     SHUNTS MV Decel Time: 183 msec     Systemic VTI:  0.23 m MV E velocity: 72.30 cm/s   Systemic Diam: 2.10 cm MV A velocity: 102.00 cm/s MV E/A ratio:  0.71 Donato Schultz MD Electronically signed by Donato Schultz MD Signature Date/Time: 08/31/2020/1:40:48 PM    Final    US PELVIC COMPLETE WITH TRANSVAGINAL  Result Date: 08/30/2020 CLINICAL DATA:  Pelvic pain abnormal CT EXAM: TRANSABDOMINAL AND TRANSVAGINAL ULTRASOUND OF PELVIS TECHNIQUE: Both transabdominal and transvaginal  ultrasound examinations of the pelvis were performed. Transabdominal technique was performed for global imaging of the pelvis including uterus, ovaries, adnexal regions, and pelvic cul-de-sac. It was necessary to proceed with endovaginal exam following the transabdominal exam to visualize the uterus and adnexa. COMPARISON:  CT 08/29/2020 FINDINGS: Uterus Measurements: 9.2 x 3.8 x 5.5 cm = volume: 101.8 mL. Hypoechoic mass posterior to the lower uterine segment, this measures 4.9 x 3.7 x 4.5 cm. Homogeneous low level internal echoes. No appreciable internal flow. Endometrium Thickness: 4.9 mm.  No focal abnormality visualized. Right ovary Not seen. Left ovary Not seen Other findings No abnormal free fluid. IMPRESSION: 1. 4.9 cm hypoechoic mass posterior to the lower uterine segment. Ultrasound appearance is indeterminate for exophytic fibroid versus possible endometrioma. Suggest correlation with pelvic MRI. 2. Nonvisualized ovaries. Electronically Signed   By: Jasmine Pang M.D.   On: 08/30/2020 18:11   CT Angio Chest/Abd/Pel for Dissection W and/or Wo Contrast  Result Date: 08/29/2020 CLINICAL DATA:  Chest and abdominal pain EXAM: CT ANGIOGRAPHY CHEST, ABDOMEN AND PELVIS TECHNIQUE: Non-contrast CT of the chest was initially obtained. Multidetector CT imaging through the chest, abdomen and pelvis was performed using the standard protocol during bolus administration of intravenous contrast. Multiplanar reconstructed images and MIPs were obtained and reviewed to evaluate the vascular anatomy. CONTRAST:  OMNIPAQUE IOHEXOL 350 MG/ML  SOLN COMPARISON:  None. FINDINGS: CTA CHEST FINDINGS Cardiovascular: Initial precontrast images demonstrate no evidence of hyperdense crescent to suggest acute aortic injury. Post-contrast images demonstrate the thoracic aorta to be well opacified without evidence of aneurysmal dilatation or dissection. No cardiac enlargement is seen. No coronary calcifications are noted. The  pulmonary artery as visualized shows a normal branching pattern. Small filling defect is noted within the right lower lobe pulmonary arterial branch centrally. This is best visualized on coronal image number 53 of series 8 and axial image number 58 of series 5. No other definitive filling defects are identified to suggest pulmonary embolism. Mediastinum/Nodes: Thoracic inlet is within normal limits. No sizable hilar or mediastinal adenopathy is noted. The esophagus as visualized is within normal limits. Lungs/Pleura: The lungs are well aerated bilaterally. No focal infiltrate or sizable effusion is noted. No parenchymal nodules are seen. Musculoskeletal: Thoracic spine is within normal limits. No acute rib abnormality is noted. Review of the MIP images confirms the above findings. CTA ABDOMEN AND PELVIS FINDINGS VASCULAR Aorta: Abdominal aorta demonstrates atherosclerotic calcifications without aneurysmal dilatation or dissection. Celiac: Patent without evidence of aneurysm, dissection, vasculitis or significant stenosis. SMA: Patent without evidence of aneurysm, dissection, vasculitis or significant stenosis. Renals: Dual renal arteries are noted on the right. Single renal artery is noted on the left. No focal stenosis is seen. IMA: Patent without evidence of aneurysm, dissection, vasculitis or significant stenosis. Inflow: Patent without evidence of aneurysm, dissection, vasculitis or significant stenosis. Veins: No specific venous abnormality is noted. Review of the MIP images confirms the above findings. NON-VASCULAR Hepatobiliary: Liver demonstrates diffuse decreased attenuation consistent with fatty infiltration. Cholelithiasis is noted without evidence of complicating factors. A small left hepatic cyst is noted. Pancreas: Unremarkable. No pancreatic ductal dilatation or surrounding inflammatory changes. Spleen: Normal in size without focal abnormality. Adrenals/Urinary Tract: Adrenal glands are within normal  limits. Kidneys demonstrate a normal enhancement pattern bilaterally. No renal calculi or obstructive changes are seen. The ureters are within normal limits. Bladder is partially distended. Stomach/Bowel: Appendix is well visualized within normal limits. No obstructive or inflammatory changes of the colon are seen. Small bowel and stomach appear within normal limits. Lymphatic: No sizable lymphadenopathy is noted. Reproductive: Uterus is well visualized and within normal limits. Soft tissue density is noted along the superior aspect of the uterus posteriorly best seen on image number 119 of series 9 and image number 243 of series 5. This measures at least 4.8 cm in greatest dimension. This may represent an exophytic fibroid although the possibility of adnexal lesion could not be totally excluded. Other: No abdominal wall hernia or abnormality. No abdominopelvic ascites. Musculoskeletal: Bilateral pars defects are noted at L5. Mild anterolisthesis is noted of L5 on S1. No other bony abnormality is seen. Review of the MIP images confirms the above findings. IMPRESSION: CTA of the chest: No evidence of aortic dilatation or dissection. Small right lower lobe pulmonary embolus without evidence of right heart strain. CTA of the abdomen and pelvis: No aortic abnormality is identified. Cholelithiasis without complicating factors. Soft tissue density along the posterosuperior aspect of the uterus as described. This could represent an exophytic fibroid posteriorly although the possibility of left adnexal mass would deserve consideration as well. Ultrasound pelvis is recommended for further evaluation and delineation. Electronically Signed   By: Alcide Clever M.D.   On: 08/29/2020 19:19   VAS [place] LOWER EXTREMITY VENOUS (DVT)  Result Date: 08/30/2020  Lower Venous DVT Study Patient Name:  NOUR RODRIGUES  Date of Exam:   08/30/2020 Medical Rec #: [id number]        Accession #:    [id number] Date of Birth: March 05, 1970        Patient  Gender: F Patient Age:   50 years Exam Location:  [facility] Procedure:      VAS [place] LOWER EXTREMITY VENOUS (DVT) Referring Phys: Lynwood Dawley --------------------------------------------------------------------------------  Indications: Pain.  Risk Factors: None identified. Limitations: Body habitus and poor ultrasound/tissue interface. Comparison Study: No prior studies. Performing Technologist: Chanda Busing RVT  Examination Guidelines: A complete evaluation includes B-mode imaging, spectral Doppler, color Doppler, and power Doppler as needed of all accessible portions of each vessel. Bilateral testing is considered an integral part of a complete examination. Limited examinations for reoccurring indications may be performed as noted. The reflux portion of the exam is performed with the patient in reverse Trendelenburg.  +-----+---------------+---------+-----------+----------+--------------+ RIGHTCompressibilityPhasicitySpontaneityPropertiesThrombus Aging +-----+---------------+---------+-----------+----------+--------------+ CFV  Full           Yes      Yes                                 +-----+---------------+---------+-----------+----------+--------------+   +---------+---------------+---------+-----------+----------+--------------+ LEFT     CompressibilityPhasicitySpontaneityPropertiesThrombus Aging +---------+---------------+---------+-----------+----------+--------------+ CFV      Full           Yes      Yes                                 +---------+---------------+---------+-----------+----------+--------------+ SFJ      Full                                                        +---------+---------------+---------+-----------+----------+--------------+ FV Prox  Full                                                        +---------+---------------+---------+-----------+----------+--------------+ FV Mid   Full                                                         +---------+---------------+---------+-----------+----------+--------------+ FV DistalFull                                                        +---------+---------------+---------+-----------+----------+--------------+ PFV      Full                                                        +---------+---------------+---------+-----------+----------+--------------+ POP      Full  Yes      Yes                                 +---------+---------------+---------+-----------+----------+--------------+ PTV      Full                                                        +---------+---------------+---------+-----------+----------+--------------+ PERO     Full                                                        +---------+---------------+---------+-----------+----------+--------------+    Summary: RIGHT: - No evidence of common femoral vein obstruction.  LEFT: - There is no evidence of deep vein thrombosis in the lower extremity.  - No cystic structure found in the popliteal fossa.  *See table(s) above for measurements and observations. Electronically signed by Sherald Hess MD on 08/30/2020 at 4:32:54 PM.    Final      Subjective: She is feeling better, breathing better.   Discharge Exam: Vitals:   09/02/20 1227 09/02/20 1646  BP: (!) 173/97 (!) 146/79  Pulse: 93 96  Resp: 20 20  Temp: 98.2 F (36.8 C) 98.3 F (36.8 C)  SpO2: 92% 96%     General: Pt is alert, awake, not in acute distress Cardiovascular: RRR, S1/S2 +, no rubs, no gallops Respiratory: CTA bilaterally, no wheezing, no rhonchi Abdominal: Soft, NT, ND, bowel sounds + Extremities: no edema, no cyanosis    The results of significant diagnostics from this hospitalization (including imaging, microbiology, ancillary and laboratory) are listed below for reference.     Microbiology: Recent Results (from the past 240 hour(s))  Resp Panel by RT-PCR (Flu A&B, Covid)  Nasopharyngeal Swab     Status: None   Collection Time: 08/29/20  7:56 PM   Specimen: Nasopharyngeal Swab; Nasopharyngeal(NP) swabs in vial transport medium  Result Value Ref Range Status   SARS Coronavirus 2 by RT PCR NEGATIVE NEGATIVE Final    Comment: (NOTE) SARS-CoV-2 target nucleic acids are NOT DETECTED.  The SARS-CoV-2 RNA is generally detectable in upper respiratory specimens during the acute phase of infection. The lowest concentration of SARS-CoV-2 viral copies this assay can detect is 138 copies/mL. A negative result does not preclude SARS-Cov-2 infection and should not be used as the sole basis for treatment or other patient management decisions. A negative result may occur with  improper specimen collection/handling, submission of specimen other than nasopharyngeal swab, presence of viral mutation(s) within the areas targeted by this assay, and inadequate number of viral copies(<138 copies/mL). A negative result must be combined with clinical observations, patient history, and epidemiological information. The expected result is Negative.  Fact Sheet for Patients:  BloggerCourse.com  Fact Sheet for Healthcare Providers:  SeriousBroker.it  This test is no t yet approved or cleared by the [place] FDA and  has been authorized for detection and/or diagnosis of SARS-CoV-2 by FDA under an Emergency Use Authorization (EUA). This EUA will remain  in effect (meaning this test can be used) for the duration of the COVID-19 declaration under Section 564(b)(1) of the Act,  21 U.S.C.section 360bbb-3(b)(1), unless the authorization is terminated  or revoked sooner.       Influenza A by PCR NEGATIVE NEGATIVE Final   Influenza B by PCR NEGATIVE NEGATIVE Final    Comment: (NOTE) The Xpert Xpress SARS-CoV-2/FLU/RSV plus assay is intended as an aid in the diagnosis of influenza from Nasopharyngeal swab specimens and should not be  used as a sole basis for treatment. Nasal washings and aspirates are unacceptable for Xpert Xpress SARS-CoV-2/FLU/RSV testing.  Fact Sheet for Patients: BloggerCourse.com  Fact Sheet for Healthcare Providers: SeriousBroker.it  This test is not yet approved or cleared by the [place] FDA and has been authorized for detection and/or diagnosis of SARS-CoV-2 by FDA under an Emergency Use Authorization (EUA). This EUA will remain in effect (meaning this test can be used) for the duration of the COVID-19 declaration under Section 564(b)(1) of the Act, 21 U.S.C. section 360bbb-3(b)(1), unless the authorization is terminated or revoked.  Performed at [facility], [address], [place], [place] [address]      Labs: BNP (last 3 results) No results for input(s): BNP in the last 8760 hours. Basic Metabolic Panel: Recent Labs  Lab 08/29/20 1703 08/31/20 0011 09/01/20 0130  NA 138 137 136  K 4.0 3.4* 4.0  CL 99 102 102  CO2 GLUCOSE 100* 89 96  BUN CREATININE 0.90 0.95 1.03*  CALCIUM 9.8 9.2 9.6  MG  --   --  2.4   Liver Function Tests: Recent Labs  Lab 09/01/20 0130  AST 23  ALT 24  ALKPHOS 72  BILITOT 1.1  PROT 7.8  ALBUMIN 3.8   No results for input(s): LIPASE, AMYLASE in the last 168 hours. No results for input(s): AMMONIA in the last 168 hours. CBC: Recent Labs  Lab 08/29/20 1703 08/31/20 0011  WBC 8.9 8.4  HGB 13.7 13.6  HCT 42.5 43.0  MCV 87.6 89.2  PLT 156 267   Cardiac Enzymes: No results for input(s): CKTOTAL, CKMB, CKMBINDEX, TROPONINI in the last 168 hours. BNP: Invalid input(s): POCBNP CBG: No results for input(s): GLUCAP in the last 168 hours. D-Dimer No results for input(s): DDIMER in the last 72 hours. Hgb A1c Recent Labs    08/31/20 0011  HGBA1C 5.5   Lipid Profile Recent Labs    08/31/20 0011 09/01/20 0130  CHOL 224* 246*  HDL 54 57   LDLCALC 146* 153*  TRIG 119 178*  CHOLHDL 4.1 4.3   Thyroid function studies Recent Labs    09/01/20 0130  TSH 2.795   Anemia work up No results for input(s): VITAMINB12, FOLATE, FERRITIN, TIBC, IRON, RETICCTPCT in the last 72 hours. Urinalysis    Component Value Date/Time   BILIRUBINUR negative 08/03/2014 1406   PROTEINUR trace 08/03/2014 1406   UROBILINOGEN 1.0 08/03/2014 1406   NITRITE negative 08/03/2014 1406   LEUKOCYTESUR Negative 08/03/2014 1406   Sepsis Labs Invalid input(s): PROCALCITONIN,  WBC,  LACTICIDVEN Microbiology Recent Results (from the past 240 hour(s))  Resp Panel by RT-PCR (Flu A&B, Covid) Nasopharyngeal Swab     Status: None   Collection Time: 08/29/20  7:56 PM   Specimen: Nasopharyngeal Swab; Nasopharyngeal(NP) swabs in vial transport medium  Result Value Ref Range Status   SARS Coronavirus 2 by RT PCR NEGATIVE NEGATIVE Final    Comment: (NOTE) SARS-CoV-2 target nucleic acids are NOT DETECTED.  The SARS-CoV-2 RNA is generally detectable in upper respiratory specimens during the acute phase of infection.  The lowest concentration of SARS-CoV-2 viral copies this assay can detect is 138 copies/mL. A negative result does not preclude SARS-Cov-2 infection and should not be used as the sole basis for treatment or other patient management decisions. A negative result may occur with  improper specimen collection/handling, submission of specimen other than nasopharyngeal swab, presence of viral mutation(s) within the areas targeted by this assay, and inadequate number of viral copies(<138 copies/mL). A negative result must be combined with clinical observations, patient history, and epidemiological information. The expected result is Negative.  Fact Sheet for Patients:  BloggerCourse.comhttps://www.fda.gov/media/152166/download  Fact Sheet for Healthcare Providers:  SeriousBroker.ithttps://www.fda.gov/media/152162/download  This test is no t yet approved or cleared by the [place] FDA and  has been authorized for detection and/or diagnosis of SARS-CoV-2 by FDA under an Emergency Use Authorization (EUA). This EUA will remain  in effect (meaning this test can be used) for the duration of the COVID-19 declaration under Section 564(b)(1) of the Act, 21 U.S.C.section 360bbb-3(b)(1), unless the authorization is terminated  or revoked sooner.       Influenza A by PCR NEGATIVE NEGATIVE Final   Influenza B by PCR NEGATIVE NEGATIVE Final    Comment: (NOTE) The Xpert Xpress SARS-CoV-2/FLU/RSV plus assay is intended as an aid in the diagnosis of influenza from Nasopharyngeal swab specimens and should not be used as a sole basis for treatment. Nasal washings and aspirates are unacceptable for Xpert Xpress SARS-CoV-2/FLU/RSV testing.  Fact Sheet for Patients: BloggerCourse.comhttps://www.fda.gov/media/152166/download  Fact Sheet for Healthcare Providers: SeriousBroker.ithttps://www.fda.gov/media/152162/download  This test is not yet approved or cleared by the [place] FDA and has been authorized for detection and/or diagnosis of SARS-CoV-2 by FDA under an Emergency Use Authorization (EUA). This EUA will remain in effect (meaning this test can be used) for the duration of the COVID-19 declaration under Section 564(b)(1) of the Act, 21 U.S.C. section 360bbb-3(b)(1), unless the authorization is terminated or revoked.  Performed at [facility], [address], [place], [place] [id number]      Time coordinating discharge: 40 minutes  SIGNED:   Alba CoryBelkys A Chelsea Pedretti, MD  [facility]

## 2020-09-02 NOTE — Progress Notes (Signed)
ANTICOAGULATION CONSULT NOTE - Initial Consult  Pharmacy Consult for apixaban Indication: pulmonary embolus  No Known Allergies  Patient Measurements: Height: 5\' 1"  (154.9 cm) Weight: 127 kg (280 lb) IBW/kg (Calculated) : 47.8 Heparin Dosing Weight: 79.7 kg  Vital Signs: Temp: 97.9 F (36.6 C) (08/11 0809) Temp Source: Oral (08/11 0809) BP: 147/83 (08/11 0809) Pulse Rate: 97 (08/11 0809)  Labs: Recent Labs    08/31/20 0011 09/01/20 0130 09/01/20 0454 09/01/20 0817  HGB 13.6  --   --   --   HCT 43.0  --   --   --   PLT 267  --   --   --   APTT 45*  --   --   --   LABPROT 13.9  --   --   --   INR 1.1  --   --   --   CREATININE 0.95 1.03*  --   --   TROPONINIHS  --   --  6 4    Estimated Creatinine Clearance: 80.2 mL/min (A) (by C-G formula based on SCr of 1.03 mg/dL (H)).   Medical History: Past Medical History:  Diagnosis Date   Allergy    Anxiety    Depression    Endometriosis of bladder    Heart murmur    IBS (irritable bowel syndrome)     Medications:  Scheduled:   amLODipine  10 mg Oral Daily   enoxaparin (LOVENOX) injection  1 mg/kg Subcutaneous Q12H   fluticasone  2 spray Each Nare Daily   hydrALAZINE  25 mg Oral Q8H   losartan  50 mg Oral Daily   pantoprazole  40 mg Oral BID   simvastatin  20 mg Oral q1800    Assessment: 50yo female with new small PE on Lovenox 126 mg SQ BID inpatient. No anticoagulation PTA. MD wishes to transition Lovenox to apixaban for discharge.  Goal of Therapy:  Prevention of VTE reoccurrence Monitor platelets by anticoagulation protocol: Yes   Plan:  D/c Lovenox. Start Eliquis 10 mg po BID x 7 days, followed by 5 mg PO BID. Start first dose 8/11 at 1200. Monitor CBC and for bleeding. Patient will be educated prior to discharge.  10/11 09/02/2020,11:04 AM

## 2020-09-03 ENCOUNTER — Other Ambulatory Visit (HOSPITAL_BASED_OUTPATIENT_CLINIC_OR_DEPARTMENT_OTHER): Payer: Self-pay

## 2020-09-03 ENCOUNTER — Emergency Department (HOSPITAL_BASED_OUTPATIENT_CLINIC_OR_DEPARTMENT_OTHER): Payer: Self-pay | Admitting: Radiology

## 2020-09-03 ENCOUNTER — Emergency Department (HOSPITAL_BASED_OUTPATIENT_CLINIC_OR_DEPARTMENT_OTHER)
Admission: EM | Admit: 2020-09-03 | Discharge: 2020-09-03 | Disposition: A | Discharge status: Home / Self Care | Payer: Self-pay | Attending: Emergency Medicine | Admitting: Emergency Medicine

## 2020-09-03 ENCOUNTER — Encounter (HOSPITAL_BASED_OUTPATIENT_CLINIC_OR_DEPARTMENT_OTHER): Payer: Self-pay

## 2020-09-03 ENCOUNTER — Emergency Department (HOSPITAL_BASED_OUTPATIENT_CLINIC_OR_DEPARTMENT_OTHER): Payer: Self-pay

## 2020-09-03 ENCOUNTER — Other Ambulatory Visit: Payer: Self-pay

## 2020-09-03 DIAGNOSIS — I10 Essential (primary) hypertension: Secondary | ICD-10-CM | POA: Insufficient documentation

## 2020-09-03 DIAGNOSIS — I2693 Single subsegmental pulmonary embolism without acute cor pulmonale: Secondary | ICD-10-CM | POA: Insufficient documentation

## 2020-09-03 LAB — CBC WITH DIFFERENTIAL/PLATELET
Abs Immature Granulocytes: 0.02 10*3/uL (ref 0.00–0.07)
Basophils Absolute: 0 10*3/uL (ref 0.0–0.1)
Basophils Relative: 0 %
Eosinophils Absolute: 0.3 10*3/uL (ref 0.0–0.5)
Eosinophils Relative: 3 %
HCT: 43.2 % (ref 36.0–46.0)
Hemoglobin: 13.6 g/dL (ref 12.0–15.0)
Immature Granulocytes: 0 %
Lymphocytes Relative: 27 %
Lymphs Abs: 2.1 10*3/uL (ref 0.7–4.0)
MCH: 27.8 pg (ref 26.0–34.0)
MCHC: 31.5 g/dL (ref 30.0–36.0)
MCV: 88.3 fL (ref 80.0–100.0)
Monocytes Absolute: 0.9 10*3/uL (ref 0.1–1.0)
Monocytes Relative: 11 %
Neutro Abs: 4.4 10*3/uL (ref 1.7–7.7)
Neutrophils Relative %: 59 %
Platelets: 213 10*3/uL (ref 150–400)
RBC: 4.89 MIL/uL (ref 3.87–5.11)
RDW: 14.7 % (ref 11.5–15.5)
WBC: 7.7 10*3/uL (ref 4.0–10.5)
nRBC: 0 % (ref 0.0–0.2)

## 2020-09-03 LAB — BASIC METABOLIC PANEL
Anion gap: 13 (ref 5–15)
BUN: 17 mg/dL (ref 6–20)
CO2: 23 mmol/L (ref 22–32)
Calcium: 9.5 mg/dL (ref 8.9–10.3)
Chloride: 101 mmol/L (ref 98–111)
Creatinine, Ser: 0.85 mg/dL (ref 0.44–1.00)
GFR, Estimated: >60 mL/min (ref >60)
Glucose, Bld: 115 mg/dL — ABNORMAL HIGH (ref 70–99)
Potassium: 4.2 mmol/L (ref 3.5–5.1)
Sodium: 137 mmol/L (ref 135–145)

## 2020-09-03 MED ORDER — IOHEXOL 350 MG/ML SOLN
100.0000 mL | Freq: Once | INTRAVENOUS | Status: AC | PRN
  Administered 2020-09-03: 100 mL via INTRAVENOUS

## 2020-09-03 NOTE — ED Notes (Signed)
Patient verbalizes understanding of discharge instructions. Opportunity for questioning and answers were provided. Patient discharged from ED.  °

## 2020-09-03 NOTE — ED Triage Notes (Signed)
Pt arrives EMS with c/o  waking up suddenly with shortness of breath. Pt recently admitted to [facility] for HTN and right lower lobe PE.

## 2020-09-03 NOTE — ED Notes (Signed)
Patient transported to CT 

## 2020-09-03 NOTE — Discharge Instructions (Addendum)
CT scan of the chest no new blood clots.  No signs of pneumonia.  Continue taking your blood pressure medicine and your Eliquis.  Return for any new or worse symptoms.  Follow-up with your doctor as scheduled.

## 2020-09-03 NOTE — ED Provider Notes (Signed)
[facility] EMERGENCY DEPT Provider Note   CSN: [id number] Arrival date & time: 09/03/20  1572     History Chief Complaint  Patient presents with   Shortness of Breath    Ashlee Farmer is a 50 y.o. female.  Patient with recent admission for hypertensive emergency and pulmonary embolus.  Patient is on Eliquis.  Patient was just discharged yesterday from the hospital.  Patient is feeling like she is having chest discomfort and difficulty breathing.  But oxygen saturations are in the upper 90s.  Blood pressure here today 155/82.  Patient is taking her medications.      Past Medical History:  Diagnosis Date   Allergy    Anxiety    Depression    Endometriosis of bladder    Heart murmur    IBS (irritable bowel syndrome)     Patient Active Problem List   Diagnosis Date Noted   Hypertensive crisis 08/29/2020   Obesity 08/03/2014   Essential hypertension 08/03/2014    History reviewed. No pertinent surgical history.   OB History   No obstetric history on file.     Family History  Problem Relation Age of Onset   Diabetes Mother    Hyperlipidemia Mother    Hypertension Mother    Diabetes Father    Heart disease Father    Hyperlipidemia Father    Hypertension Father    Hyperlipidemia Brother    Hypertension Brother     Social History   Tobacco Use   Smoking status: Never   Smokeless tobacco: Never  Substance Use Topics   Drug use: No      Allergies    Patient has no known allergies.  Review of Systems   Review of Systems  Constitutional:  Positive for fatigue. Negative for chills and fever.  HENT:  Negative for ear pain and sore throat.   Eyes:  Negative for pain and visual disturbance.  Respiratory:  Positive for shortness of breath. Negative for cough.   Cardiovascular:  Negative for chest pain and palpitations.  Gastrointestinal:  Negative for abdominal pain and vomiting.  Genitourinary:  Negative for dysuria and hematuria.   Musculoskeletal:  Negative for arthralgias and back pain.  Skin:  Negative for color change and rash.  Neurological:  Negative for seizures and syncope.  All other systems reviewed and are negative.  Physical Exam Updated Vital Signs BP (!) 184/93 (BP Location: Right Arm)   Pulse (!) 103   Temp 97.9 F (36.6 C)   Resp (!) 22   Ht 1.549 m (5\' 1" )   Wt 127 kg   SpO2 98%   BMI 52.90 kg/m   Physical Exam Vitals and nursing note reviewed.  Constitutional:      General: She is not in acute distress.    Appearance: Normal appearance. She is well-developed.  HENT:     Head: Normocephalic and atraumatic.  Eyes:     Extraocular Movements: Extraocular movements intact.     Conjunctiva/sclera: Conjunctivae normal.     Pupils: Pupils are equal, round, and reactive to light.  Cardiovascular:     Rate and Rhythm: Normal rate and regular rhythm.     Heart sounds: No murmur heard. Pulmonary:     Effort: Pulmonary effort is normal. No respiratory distress.     Breath sounds: Normal breath sounds. No wheezing, rhonchi or rales.  Chest:     Chest wall: No tenderness.  Abdominal:     Palpations: Abdomen is soft.  Tenderness: There is no abdominal tenderness.  Musculoskeletal:     Cervical back: Neck supple.  Skin:    General: Skin is warm and dry.  Neurological:     General: No focal deficit present.     Mental Status: She is alert and oriented to person, place, and time.    ED Results / Procedures / Treatments   Labs (all labs ordered are listed, but only abnormal results are displayed) Labs Reviewed  BASIC METABOLIC PANEL - Abnormal; Notable for the following components:      Result Value   Glucose, Bld 115 (*)    All other components within normal limits  CBC WITH DIFFERENTIAL/PLATELET    EKG EKG Interpretation  Date/Time:  Friday September 03 2020 06:42:53 EDT Ventricular Rate:  105 PR Interval:  173 QRS Duration: 91 QT Interval:  345 QTC Calculation: 456 R  Axis:   -8 Text Interpretation: Sinus tachycardia Left ventricular hypertrophy Anterior infarct, old Nonspecific T abnormalities, lateral leads No significant change since last tracing Confirmed by Zadie Rhine ([id number]) on 09/03/2020 6:46:35 AM  Radiology DG Chest 2 View  Result Date: 09/03/2020 CLINICAL DATA:  Shortness of breath. EXAM: CHEST - 2 VIEW COMPARISON:  09/01/2020 FINDINGS: The heart size and mediastinal contours are within normal limits. Compared to the prior study, there is slight increase in prominence of pulmonary vascularity and interstitium suggestive some degree of pulmonary interstitial edema or atypical pneumonia. No overt airspace edema, pleural fluid, pneumothorax or consolidation is identified. The visualized skeletal structures are unremarkable. IMPRESSION: Suggestion of pulmonary interstitial edema or atypical interstitial pneumonia since the prior study 2 days ago. Electronically Signed   By: Irish Lack M.D.   On: 09/03/2020 08:12   CT Angio Chest PE W/Cm &/Or Wo Cm  Result Date: 09/03/2020 CLINICAL DATA:  Patient just discharged for right lower lobe pulmonary embolus, concerns raised on chest x-ray. Sudden shortness of breath on waking up EXAM: CT ANGIOGRAPHY CHEST WITH CONTRAST TECHNIQUE: Multidetector CT imaging of the chest was performed using the standard protocol during bolus administration of intravenous contrast. Multiplanar CT image reconstructions and MIPs were obtained to evaluate the vascular anatomy. CONTRAST:  OMNIPAQUE IOHEXOL 350 MG/ML SOLN COMPARISON:  CT chest/abdomen/pelvis 08/29/2020 FINDINGS: Cardiovascular: Satisfactory opacification of the pulmonary arteries to the segmental level. The previously seen previously seen right lower lobe is not identified. There is no evidence of pulmonary embolus on the current study. The heart size is stable. There is no evidence of right heart strain. Mediastinum/Nodes: The thyroid is unremarkable. There is no  mediastinal, hilar, or axillary lymphadenopathy. Lungs/Pleura: The trachea and central airways are patent. There is mosaic attenuation in the lungs. There is no focal consolidation or pulmonary edema. There is no pleural effusion or pneumothorax. Upper Abdomen: There is cholelithiasis without evidence of acute cholecystitis. A small hepatic cyst is unchanged. The imaged portions of the upper abdominal viscera are otherwise unremarkable. Musculoskeletal: There is no acute osseous abnormality. Review of the MIP images confirms the above findings. IMPRESSION: 1. No evidence of acute pulmonary embolus. The previously seen right lower lobe filling defect is not identified on the current study. 2. Mosaic attenuation in the lungs suggesting air trapping/small airway disease. No focal consolidation. Electronically Signed   By: Lesia Hausen MD   On: 09/03/2020 10:05    Procedures Procedures   Medications Ordered in ED Medications  iohexol (OMNIPAQUE) 350 MG/ML injection 100 mL (100 mLs Intravenous Contrast Given 09/03/20 4196)    ED Course  I have reviewed the triage vital signs and the nursing notes.  Pertinent labs & imaging results that were available during my care of the patient were reviewed by me and considered in my medical decision making (see chart for details).    MDM Rules/Calculators/A&P                           Patient's labs no leukocytosis hemoglobin 13.6.  Lactic lites without any significant abnormalities.  Chest x-ray some concerns about a new infiltrate.  So CT angio was repeated.  No evidence of any new pulmonary embolus.  No evidence of any pneumonia.  Patient stable for discharge home.   Final Clinical Impression(s) / ED Diagnoses Final diagnoses:  Single subsegmental pulmonary embolism without acute cor pulmonale (HCC)  Primary hypertension    Rx / DC Orders ED Discharge Orders     None        Vanetta Mulders, MD 09/03/20 1100

## 2020-09-07 ENCOUNTER — Telehealth (HOSPITAL_COMMUNITY): Payer: Self-pay | Admitting: Student-PharmD

## 2020-09-07 ENCOUNTER — Other Ambulatory Visit (HOSPITAL_COMMUNITY): Payer: Self-pay

## 2020-09-07 NOTE — Telephone Encounter (Signed)
Transitions of Care Pharmacy  ° °Call attempted for a pharmacy transitions of care follow-up. HIPAA appropriate voicemail was left with call back information provided.  ° °Call attempt #1. Will follow-up in 2-3 days.  °  °

## 2020-09-14 ENCOUNTER — Telehealth (HOSPITAL_COMMUNITY): Payer: Self-pay | Admitting: Student-PharmD

## 2020-09-14 ENCOUNTER — Other Ambulatory Visit (HOSPITAL_COMMUNITY): Payer: Self-pay

## 2020-09-14 NOTE — Telephone Encounter (Signed)
Pharmacy Transitions of Care Follow-up Telephone Call   Date of discharge: 09/02/2020  Discharge Diagnosis: PE   How have you been since you were released from the hospital? Patient has been on bedrest but slowly improving.    Medication changes made at discharge:          START taking: amLODipine (NORVASC)  Eliquis DVT/PE Starter Pack (Apixaban Starter Pack (10mg  and 5mg ))  Eliquis (apixaban)  Start taking on: September 09, 2020 hydrALAZINE (APRESOLINE)  losartan (COZAAR)  pantoprazole (PROTONIX)  simvastatin (ZOCOR)    Medication changes verified by the patient? Yes    Medication Accessibility:   [facility] [address] [facility] [address] [facility]); Per CM, pt should be set up for [facility]     Was the patient provided with refills on discharged medications? Not on eliquis, but on other d/c meds (amlodipine, hydralazine, losartan, simvastatin)   Have all prescriptions been transferred from [facility] to home pharmacy? No. Planning on being transferred to [facility]    Is the patient able to afford medications? Patient was discharged on [facility] program. Will likely need patient assistance. Patient received Eliquis starter pack and 60 tablets of Eliquis 5mg .     Medication Review:   APIXABAN (ELIQUIS)  Apixaban 10 mg BID initiated on 09/02/20. Will switch to apixaban 5 mg BID after 7 days (09/09/20).  - Discussed importance of taking medication around the same time everyday  - Advised patient of medications to avoid (NSAIDs, ASA). Patient states she's taking ibuprofen once for headache/inflammation. Patient is aware and acknowledges risk of bleeding with using ibuprofen and will discuss with doctor.  - Educated that Tylenol (acetaminophen) will be the preferred analgesic to prevent risk of bleeding  - Emphasized importance of monitoring for signs and symptoms of bleeding (abnormal bruising, prolonged bleeding, nose bleeds, bleeding from gums, discolored urine, black tarry stools)  - Advised patient  to alert all providers of anticoagulation therapy prior to starting a new medication or having a procedure    Follow-up Appointments:   PCP Hospital f/u appt confirmed?  Scheduled to see Dr. on 09/23/2020 @ Primary Care at [facility].   Specialist Hospital f/u appt confirmed? N/A   If their condition worsens, is the pt aware to call PCP or go to the Emergency Dept.? Yes   Final Patient Assessment: Patient is taking medications as appropriately and has follow up appointment with primary care. Patient's medications are planned to be filled at [facility] so they have not been transferred.

## 2020-09-23 ENCOUNTER — Inpatient Hospital Stay: Payer: Self-pay | Admitting: Family Medicine

## 2020-09-30 ENCOUNTER — Other Ambulatory Visit: Payer: Self-pay

## 2020-10-01 ENCOUNTER — Other Ambulatory Visit: Payer: Self-pay

## 2020-10-06 ENCOUNTER — Inpatient Hospital Stay (INDEPENDENT_AMBULATORY_CARE_PROVIDER_SITE_OTHER): Payer: Self-pay | Admitting: Primary Care

## 2020-10-13 ENCOUNTER — Other Ambulatory Visit: Payer: Self-pay

## 2020-10-13 ENCOUNTER — Ambulatory Visit (INDEPENDENT_AMBULATORY_CARE_PROVIDER_SITE_OTHER): Payer: Self-pay | Admitting: Family Medicine

## 2020-10-13 ENCOUNTER — Encounter: Payer: Self-pay | Admitting: Family Medicine

## 2020-10-13 VITALS — BP 148/84 | HR 101 | Temp 98.6°F | Resp 16 | Ht 62.0 in | Wt 256.2 lb

## 2020-10-13 DIAGNOSIS — I10 Essential (primary) hypertension: Secondary | ICD-10-CM

## 2020-10-13 DIAGNOSIS — E66813 Obesity, class 3: Secondary | ICD-10-CM

## 2020-10-13 DIAGNOSIS — Z7689 Persons encountering health services in other specified circumstances: Secondary | ICD-10-CM

## 2020-10-13 DIAGNOSIS — Z6841 Body Mass Index (BMI) 40.0 and over, adult: Secondary | ICD-10-CM

## 2020-10-13 DIAGNOSIS — Z86711 Personal history of pulmonary embolism: Secondary | ICD-10-CM

## 2020-10-13 MED ORDER — LOSARTAN POTASSIUM 100 MG PO TABS
100.0000 mg | ORAL_TABLET | Freq: Every day | ORAL | 0 refills | Status: DC
  Filled 2020-10-13 – 2020-10-25 (×2): qty 30, 30d supply, fill #0
  Filled 2020-11-29: qty 30, 30d supply, fill #1
  Filled 2020-12-28: qty 30, 30d supply, fill #2

## 2020-10-13 MED ORDER — WARFARIN SODIUM 5 MG PO TABS
5.0000 mg | ORAL_TABLET | Freq: Every day | ORAL | 1 refills | Status: DC
  Filled 2020-10-13: qty 30, 30d supply, fill #0

## 2020-10-13 NOTE — Progress Notes (Signed)
New Patient Office Visit  Subjective:  Patient ID: Ashlee Farmer, female    DOB: 02/23/1970  Age: 50 y.o. MRN: [id number]  CC:  Chief Complaint  Patient presents with   Establish Care   Follow-up    Hospital f/u   Medication Refill    HPI Ashlee Farmer presents for to establish care and for hospital follow-up.  Patient was recently hospitalized with hypertensive urgency as well as a pulmonary embolism.  She was placed on Eliquis in the hospital for anticoagulation and she is concerned that she will not be able to afford the additional 4 months of the medication that she is required to do the 6 months total that is recommended.  Past Medical History:  Diagnosis Date   Allergy    Anxiety    Depression    Endometriosis of bladder    Heart murmur    IBS (irritable bowel syndrome)     Past Surgical History:  Procedure Laterality Date   ABLATION ON ENDOMETRIOSIS      Family History  Problem Relation Age of Onset   Diabetes Mother    Hyperlipidemia Mother    Hypertension Mother    Diabetes Father    Heart disease Father    Hyperlipidemia Father    Hypertension Father    Hyperlipidemia Brother    Hypertension Brother     Social History   Socioeconomic History   Marital status: Divorced    Spouse name: Not on file   Number of children: Not on file   Years of education: Not on file   Highest education level: Not on file  Occupational History   Not on file  Tobacco Use   Smoking status: Never   Smokeless tobacco: Never  Substance and Sexual Activity   Alcohol use: Not on file   Drug use: No   Sexual activity: Not on file  Other Topics Concern   Not on file  Social History Narrative   Not on file   Social Determinants of Health   Financial Resource Strain: Not on file  Food Insecurity: Not on file  Transportation Needs: Not on file  Physical Activity: Not on file  Stress: Not on file  Social Connections: Not on file  Intimate Partner Violence: Not  on file    ROS Review of Systems  Respiratory:  Negative for shortness of breath.   Cardiovascular:  Negative for chest pain.  All other systems reviewed and are negative.  Objective:   Today's Vitals: BP (!) 148/84 (BP Location: Right Arm, Patient Position: Sitting, Cuff Size: Large)   Pulse (!) 101   Temp 98.6 F (37 C) (Oral)   Resp 16   Ht 5\' 2"  (1.575 m)   Wt 256 lb 3.2 oz (116.2 kg)   SpO2 96%   BMI 46.86 kg/m   Physical Exam Vitals and nursing note reviewed.  Constitutional:      General: She is not in acute distress.    Appearance: She is obese.  Cardiovascular:     Rate and Rhythm: Normal rate and regular rhythm.  Pulmonary:     Effort: Pulmonary effort is normal.     Breath sounds: Normal breath sounds.  Abdominal:     Palpations: Abdomen is soft.     Tenderness: There is no abdominal tenderness.  Musculoskeletal:     Right lower leg: No edema.     Left lower leg: No edema.  Neurological:     General: No focal deficit  present.     Mental Status: She is alert and oriented to person, place, and time.    Assessment & Plan:   1. Essential hypertension Blood pressure readings are yet elevated.  Will increase losartan from 50 mg to 100 mg p.o.  Will monitor.  2. Personal history of PE (pulmonary embolism) Recommendation is for patient to be anticoagulated for 6 months.  Discussed options with patient.  Patient was told that she might have to start Coumadin if she is unable to obtain the Eliquis.    3. Class 3 severe obesity due to excess calories with serious comorbidity and body mass index (BMI) of 45.0 to 49.9 in adult [facility]) Discussed dietary and activity options.  Goal is for weight loss of 2 to 4 pounds per month.  4. Encounter to establish care      Follow-up: Return in about 3 weeks (around 11/03/2020) for follow up.   Tommie Raymond, MD

## 2020-10-13 NOTE — Progress Notes (Signed)
Patient is new to provider. HFU from blood clot, HBP Patient would like to talk about medication change

## 2020-10-20 ENCOUNTER — Other Ambulatory Visit: Payer: Self-pay

## 2020-10-20 ENCOUNTER — Other Ambulatory Visit: Payer: Self-pay | Admitting: Family Medicine

## 2020-10-21 NOTE — Telephone Encounter (Signed)
Patient request

## 2020-10-22 ENCOUNTER — Other Ambulatory Visit: Payer: Self-pay

## 2020-10-22 MED ORDER — PANTOPRAZOLE SODIUM 40 MG PO TBEC
40.0000 mg | DELAYED_RELEASE_TABLET | Freq: Two times a day (BID) | ORAL | 0 refills | Status: DC
  Filled 2020-10-22: qty 60, 30d supply, fill #0

## 2020-10-25 ENCOUNTER — Other Ambulatory Visit: Payer: Self-pay

## 2020-10-27 ENCOUNTER — Other Ambulatory Visit: Payer: Self-pay

## 2020-11-02 ENCOUNTER — Other Ambulatory Visit: Payer: Self-pay

## 2020-11-03 ENCOUNTER — Encounter: Payer: Self-pay | Admitting: Family Medicine

## 2020-11-03 ENCOUNTER — Other Ambulatory Visit: Payer: Self-pay

## 2020-11-03 ENCOUNTER — Ambulatory Visit (INDEPENDENT_AMBULATORY_CARE_PROVIDER_SITE_OTHER): Payer: Self-pay | Admitting: Family Medicine

## 2020-11-03 VITALS — BP 134/81 | HR 97 | Temp 98.2°F | Resp 16 | Wt 255.4 lb

## 2020-11-03 DIAGNOSIS — I10 Essential (primary) hypertension: Secondary | ICD-10-CM

## 2020-11-03 DIAGNOSIS — E66813 Obesity, class 3: Secondary | ICD-10-CM

## 2020-11-03 DIAGNOSIS — Z6841 Body Mass Index (BMI) 40.0 and over, adult: Secondary | ICD-10-CM

## 2020-11-03 DIAGNOSIS — Z86711 Personal history of pulmonary embolism: Secondary | ICD-10-CM

## 2020-11-03 MED ORDER — SIMVASTATIN 20 MG PO TABS
20.0000 mg | ORAL_TABLET | Freq: Every day | ORAL | 1 refills | Status: DC
  Filled 2020-11-03: qty 30, 30d supply, fill #0
  Filled 2020-12-10: qty 30, 30d supply, fill #1
  Filled 2021-01-10: qty 30, 30d supply, fill #2
  Filled 2021-02-15: qty 30, 30d supply, fill #0
  Filled 2021-02-15: qty 30, 30d supply, fill #3
  Filled 2021-03-21: qty 30, 30d supply, fill #1
  Filled 2021-04-21: qty 30, 30d supply, fill #2

## 2020-11-03 NOTE — Progress Notes (Signed)
Patient said that she would like to talk about here medication refills

## 2020-11-04 ENCOUNTER — Encounter: Payer: Self-pay | Admitting: Family Medicine

## 2020-11-04 NOTE — Progress Notes (Signed)
Established  Patient Office Visit  Subjective:  Patient ID: Ashlee Farmer, female    DOB: [birth year]  Age: 50 y.o. MRN: [id number]  CC:  Chief Complaint  Patient presents with   Medication Refill    HPI Ashlee Farmer presents for follow-up of hypertension.  Patient also has some concerns about medication for her recent PE.  Patient reports that she is financially unable to get Eliquis at this time and the recommendation was for anticoagulation for about 6 months of which she has 3 remaining.  She is not desiring to go on Coumadin at this time.  She wants to know what her other options might be.  Past Medical History:  Diagnosis Date   Allergy    Anxiety    Depression    Endometriosis of bladder    Heart murmur    IBS (irritable bowel syndrome)        Social History   Socioeconomic History   Marital status: Divorced    Spouse name: Not on file   Number of children: Not on file   Years of education: Not on file   Highest education level: Not on file  Occupational History   Not on file  Tobacco Use   Smoking status: Never   Smokeless tobacco: Never  Substance and Sexual Activity   Alcohol use: Not on file   Drug use: No   Sexual activity: Not on file  Other Topics Concern   Not on file  Social History Narrative   Not on file   Social Determinants of Health   Financial Resource Strain: Not on file  Food Insecurity: Not on file  Transportation Needs: Not on file  Physical Activity: Not on file  Stress: Not on file  Social Connections: Not on file  Intimate Partner Violence: Not on file    ROS Review of Systems  Respiratory: Negative.    Cardiovascular: Negative.   All other systems reviewed and are negative.  Objective:   Today's Vitals: BP 134/81   Pulse 97   Temp 98.2 F (36.8 C) (Oral)   Resp 16   Wt 255 lb 6.4 oz (115.8 kg)   SpO2 95%   BMI 46.71 kg/m   Physical Exam Vitals and nursing note reviewed.  Constitutional:      General:  She is not in acute distress.    Appearance: She is obese.  Cardiovascular:     Rate and Rhythm: Normal rate and regular rhythm.  Pulmonary:     Effort: Pulmonary effort is normal.     Breath sounds: Normal breath sounds.  Abdominal:     Palpations: Abdomen is soft.     Tenderness: There is no abdominal tenderness.  Musculoskeletal:     Right lower leg: No edema.     Left lower leg: No edema.  Neurological:     General: No focal deficit present.     Mental Status: She is alert and oriented to person, place, and time.    Assessment & Plan:   1. Essential hypertension Readings are much improved with present management.  Continue and monitor.  2. Personal history of PE (pulmonary embolism) After long discussion, patient has decided to utilize aspirin 325 mg p.o. daily for the remainder 3 months of anticoagulation therapy.  Patient again emphasizes that she is not wanting to do Coumadin and is unable to afford Eliquis.  However, she will try to see if she can utilize other resources to get Eliquis to complete  the recommended 6 months.  3. Class 3 severe obesity due to excess calories with serious comorbidity and body mass index (BMI) of 45.0 to 49.9 in adult [facility]) Continue with the discussed dietary and activity options.  Patient goal continues to be 3 to 6 pounds weight loss per month.       Follow-up: Return in about 3 months (around 02/03/2021) for follow up.   Tommie Raymond, MD

## 2020-11-08 ENCOUNTER — Other Ambulatory Visit: Payer: Self-pay

## 2020-11-17 ENCOUNTER — Ambulatory Visit: Payer: Self-pay | Attending: Family Medicine

## 2020-11-17 ENCOUNTER — Other Ambulatory Visit: Payer: Self-pay

## 2020-11-17 ENCOUNTER — Ambulatory Visit: Payer: Self-pay

## 2020-11-25 ENCOUNTER — Ambulatory Visit: Payer: Self-pay

## 2020-11-29 ENCOUNTER — Other Ambulatory Visit: Payer: Self-pay

## 2020-12-01 ENCOUNTER — Other Ambulatory Visit: Payer: Self-pay

## 2020-12-01 ENCOUNTER — Ambulatory Visit: Payer: Self-pay | Attending: Family Medicine

## 2020-12-10 ENCOUNTER — Other Ambulatory Visit: Payer: Self-pay

## 2020-12-28 ENCOUNTER — Other Ambulatory Visit: Payer: Self-pay

## 2020-12-28 ENCOUNTER — Other Ambulatory Visit: Payer: Self-pay | Admitting: Family Medicine

## 2020-12-28 MED ORDER — AMLODIPINE BESYLATE 10 MG PO TABS
10.0000 mg | ORAL_TABLET | Freq: Every day | ORAL | 3 refills | Status: DC
  Filled 2020-12-28: qty 30, 30d supply, fill #0
  Filled 2021-01-24: qty 90, 90d supply, fill #1
  Filled 2021-01-25: qty 30, 30d supply, fill #1
  Filled 2021-03-02: qty 30, 30d supply, fill #0
  Filled 2021-03-02: qty 30, 30d supply, fill #2
  Filled 2021-03-31: qty 30, 30d supply, fill #1

## 2020-12-30 ENCOUNTER — Other Ambulatory Visit: Payer: Self-pay

## 2021-01-10 ENCOUNTER — Other Ambulatory Visit: Payer: Self-pay

## 2021-01-10 ENCOUNTER — Other Ambulatory Visit: Payer: Self-pay | Admitting: Family Medicine

## 2021-01-10 MED ORDER — PANTOPRAZOLE SODIUM 40 MG PO TBEC
40.0000 mg | DELAYED_RELEASE_TABLET | Freq: Two times a day (BID) | ORAL | 0 refills | Status: DC
  Filled 2021-01-10: qty 60, 30d supply, fill #0

## 2021-01-11 ENCOUNTER — Other Ambulatory Visit: Payer: Self-pay

## 2021-01-11 ENCOUNTER — Other Ambulatory Visit: Payer: Self-pay | Admitting: Family Medicine

## 2021-01-13 ENCOUNTER — Other Ambulatory Visit: Payer: Self-pay | Admitting: Family Medicine

## 2021-01-13 ENCOUNTER — Other Ambulatory Visit: Payer: Self-pay

## 2021-01-13 MED ORDER — HYDRALAZINE HCL 25 MG PO TABS
25.0000 mg | ORAL_TABLET | Freq: Three times a day (TID) | ORAL | 3 refills | Status: DC
  Filled 2021-01-13 – 2021-02-15 (×2): qty 90, 30d supply, fill #0
  Filled 2021-02-15 – 2021-03-21 (×2): qty 90, 30d supply, fill #1
  Filled 2021-04-21: qty 90, 30d supply, fill #2

## 2021-01-14 ENCOUNTER — Other Ambulatory Visit: Payer: Self-pay

## 2021-01-24 ENCOUNTER — Other Ambulatory Visit: Payer: Self-pay

## 2021-01-24 ENCOUNTER — Other Ambulatory Visit: Payer: Self-pay | Admitting: Family Medicine

## 2021-01-25 ENCOUNTER — Other Ambulatory Visit: Payer: Self-pay

## 2021-01-25 MED ORDER — LOSARTAN POTASSIUM 100 MG PO TABS
100.0000 mg | ORAL_TABLET | Freq: Every day | ORAL | 0 refills | Status: DC
  Filled 2021-01-25: qty 30, 30d supply, fill #0
  Filled 2021-01-25: qty 90, 90d supply, fill #0
  Filled 2021-03-02: qty 30, 30d supply, fill #0
  Filled 2021-03-02 – 2021-03-31 (×2): qty 30, 30d supply, fill #1

## 2021-01-26 ENCOUNTER — Other Ambulatory Visit: Payer: Self-pay

## 2021-01-28 ENCOUNTER — Other Ambulatory Visit (HOSPITAL_COMMUNITY): Payer: Self-pay

## 2021-02-15 ENCOUNTER — Other Ambulatory Visit: Payer: Self-pay

## 2021-02-17 ENCOUNTER — Other Ambulatory Visit: Payer: Self-pay

## 2021-02-28 ENCOUNTER — Ambulatory Visit: Payer: Self-pay | Admitting: Family Medicine

## 2021-03-02 ENCOUNTER — Other Ambulatory Visit: Payer: Self-pay

## 2021-03-04 ENCOUNTER — Other Ambulatory Visit: Payer: Self-pay

## 2021-03-21 ENCOUNTER — Other Ambulatory Visit: Payer: Self-pay | Admitting: Family Medicine

## 2021-03-21 ENCOUNTER — Other Ambulatory Visit: Payer: Self-pay

## 2021-03-21 MED ORDER — PANTOPRAZOLE SODIUM 40 MG PO TBEC
40.0000 mg | DELAYED_RELEASE_TABLET | Freq: Two times a day (BID) | ORAL | 0 refills | Status: DC
  Filled 2021-03-21 – 2021-04-21 (×2): qty 60, 30d supply, fill #0

## 2021-03-28 ENCOUNTER — Other Ambulatory Visit: Payer: Self-pay

## 2021-03-31 ENCOUNTER — Other Ambulatory Visit: Payer: Self-pay

## 2021-04-01 ENCOUNTER — Other Ambulatory Visit: Payer: Self-pay

## 2021-04-21 ENCOUNTER — Other Ambulatory Visit: Payer: Self-pay

## 2021-04-22 ENCOUNTER — Other Ambulatory Visit: Payer: Self-pay

## 2021-04-29 ENCOUNTER — Other Ambulatory Visit: Payer: Self-pay | Admitting: Family Medicine

## 2021-05-02 ENCOUNTER — Other Ambulatory Visit: Payer: Self-pay

## 2021-05-02 MED ORDER — LOSARTAN POTASSIUM 100 MG PO TABS
100.0000 mg | ORAL_TABLET | Freq: Every day | ORAL | 0 refills | Status: DC
  Filled 2021-05-02: qty 30, 30d supply, fill #0

## 2021-05-02 MED ORDER — AMLODIPINE BESYLATE 10 MG PO TABS
10.0000 mg | ORAL_TABLET | Freq: Every day | ORAL | 0 refills | Status: DC
  Filled 2021-05-02: qty 30, 30d supply, fill #0

## 2021-05-02 NOTE — Telephone Encounter (Signed)
Courtesy refill. Left message to make appointment. ?Requested Prescriptions  ?Pending Prescriptions Disp Refills  ?? losartan (COZAAR) 100 MG tablet 30 tablet 0  ?  Sig: Take 1 tablet (100 mg total) by mouth daily.  ?  ? Cardiovascular:  Angiotensin Receptor Blockers Failed - 04/29/2021  4:01 PM  ?  ?  Failed - Cr in normal range and within 180 days  ?  Creat  ?Date Value Ref Range Status  ?08/03/2014 0.72 0.50 - 1.10 mg/dL Final  ? ?Creatinine, Ser  ?Date Value Ref Range Status  ?09/03/2020 0.85 0.44 - 1.00 mg/dL Final  ?   ?  ?  Failed - K in normal range and within 180 days  ?  Potassium  ?Date Value Ref Range Status  ?09/03/2020 4.2 3.5 - 5.1 mmol/L Final  ?   ?  ?  Passed - Patient is not pregnant  ?  ?  Passed - Last BP in normal range  ?  BP Readings from Last 1 Encounters:  ?11/03/20 134/81  ?   ?  ?  Passed - Valid encounter within last 6 months  ?  Recent Outpatient Visits   ?      ? 6 months ago Essential hypertension  ? Primary Care at [facility], MD  ? 6 months ago Essential hypertension  ? Primary Care at [facility], MD  ? 5 years ago Jaw pain  ? Primary Care at [place], PA-C  ? 6 years ago Acute recurrent maxillary sinusitis  ? [facility] at [facility] Laurey Arrow, MD  ? 6 years ago Swimmer's ear of both sides  ? [facility], Janalee Dane, MD  ?  ?  ? ?  ?  ?  ?? amLODipine (NORVASC) 10 MG tablet 30 tablet 0  ?  Sig: Take 1 tablet (10 mg total) by mouth daily.  ?  ? Cardiovascular: Calcium Channel Blockers 2 Passed - 04/29/2021  4:01 PM  ?  ?  Passed - Last BP in normal range  ?  BP Readings from Last 1 Encounters:  ?11/03/20 134/81  ?   ?  ?  Passed - Last Heart Rate in normal range  ?  Pulse Readings from Last 1 Encounters:  ?11/03/20 97  ?   ?  ?  Passed - Valid encounter within last 6 months  ?  Recent Outpatient Visits   ?      ? 6 months ago Essential hypertension  ? Primary Care at [facility], MD  ? 6 months ago  Essential hypertension  ? Primary Care at [facility], MD  ? 5 years ago Jaw pain  ? Primary Care at [place], PA-C  ? 6 years ago Acute recurrent maxillary sinusitis  ? [facility] at [facility] Laurey Arrow, MD  ? 6 years ago Swimmer's ear of both sides  ? [facility] Jan[facility]e Dane, MD  ?  ?  ? ?  ?  ?  ? ?

## 2021-05-06 ENCOUNTER — Other Ambulatory Visit: Payer: Self-pay

## 2021-05-23 ENCOUNTER — Other Ambulatory Visit: Payer: Self-pay

## 2021-05-23 ENCOUNTER — Other Ambulatory Visit: Payer: Self-pay | Admitting: Family Medicine

## 2021-05-25 ENCOUNTER — Telehealth: Payer: Self-pay | Admitting: *Deleted

## 2021-05-25 ENCOUNTER — Other Ambulatory Visit: Payer: Self-pay | Admitting: *Deleted

## 2021-05-25 ENCOUNTER — Other Ambulatory Visit: Payer: Self-pay

## 2021-05-25 MED ORDER — HYDRALAZINE HCL 25 MG PO TABS
25.0000 mg | ORAL_TABLET | Freq: Three times a day (TID) | ORAL | 0 refills | Status: DC
  Filled 2021-05-25: qty 30, 10d supply, fill #0

## 2021-05-25 MED ORDER — SIMVASTATIN 20 MG PO TABS
20.0000 mg | ORAL_TABLET | Freq: Every day | ORAL | 0 refills | Status: DC
  Filled 2021-05-25: qty 30, 30d supply, fill #0

## 2021-05-25 NOTE — Telephone Encounter (Signed)
Copied from CRM [id number]. Topic: General - Other ?>> May 25, 2021  9:58 AM Pawlus, Maxine Glenn A wrote: ?Reason for CRM: Pt was beginning to become upset regarding her medications, pt wanted a call back to know why they were denied. ? ? ?Patient was called and VM left for patient to call back . Patient will need an appt for more refills. Patient had f/u appt that should have been in January.Kieth Brightly ? ?

## 2021-05-26 ENCOUNTER — Other Ambulatory Visit: Payer: Self-pay

## 2021-05-27 ENCOUNTER — Other Ambulatory Visit: Payer: Self-pay

## 2021-05-30 ENCOUNTER — Ambulatory Visit (INDEPENDENT_AMBULATORY_CARE_PROVIDER_SITE_OTHER): Payer: Self-pay | Admitting: Physician Assistant

## 2021-05-30 ENCOUNTER — Other Ambulatory Visit: Payer: Self-pay

## 2021-05-30 ENCOUNTER — Encounter: Payer: Self-pay | Admitting: Physician Assistant

## 2021-05-30 VITALS — BP 136/87 | HR 104 | Temp 98.2°F | Resp 18 | Ht 61.0 in | Wt 292.0 lb

## 2021-05-30 DIAGNOSIS — Z6841 Body Mass Index (BMI) 40.0 and over, adult: Secondary | ICD-10-CM

## 2021-05-30 DIAGNOSIS — I10 Essential (primary) hypertension: Secondary | ICD-10-CM

## 2021-05-30 DIAGNOSIS — Z1231 Encounter for screening mammogram for malignant neoplasm of breast: Secondary | ICD-10-CM

## 2021-05-30 DIAGNOSIS — Z1159 Encounter for screening for other viral diseases: Secondary | ICD-10-CM

## 2021-05-30 DIAGNOSIS — K0889 Other specified disorders of teeth and supporting structures: Secondary | ICD-10-CM

## 2021-05-30 MED ORDER — SIMVASTATIN 20 MG PO TABS
20.0000 mg | ORAL_TABLET | Freq: Every day | ORAL | 1 refills | Status: DC
  Filled 2021-05-30: qty 90, 90d supply, fill #0
  Filled 2021-07-04: qty 30, 30d supply, fill #0
  Filled 2021-08-08: qty 30, 30d supply, fill #1
  Filled 2021-09-08: qty 30, 30d supply, fill #2

## 2021-05-30 MED ORDER — AMLODIPINE BESYLATE 10 MG PO TABS
10.0000 mg | ORAL_TABLET | Freq: Every day | ORAL | 1 refills | Status: DC
  Filled 2021-05-30: qty 30, 30d supply, fill #0
  Filled 2021-07-04: qty 30, 30d supply, fill #1
  Filled 2021-08-08: qty 30, 30d supply, fill #2
  Filled 2021-09-08: qty 30, 30d supply, fill #3

## 2021-05-30 MED ORDER — AMOXICILLIN 500 MG PO CAPS
500.0000 mg | ORAL_CAPSULE | Freq: Three times a day (TID) | ORAL | 0 refills | Status: AC
  Filled 2021-05-30: qty 30, 10d supply, fill #0

## 2021-05-30 MED ORDER — HYDRALAZINE HCL 25 MG PO TABS
25.0000 mg | ORAL_TABLET | Freq: Three times a day (TID) | ORAL | 2 refills | Status: DC
  Filled 2021-05-30 – 2021-06-07 (×2): qty 90, 30d supply, fill #0
  Filled 2021-07-18 – 2021-07-25 (×3): qty 90, 30d supply, fill #1
  Filled 2021-09-08: qty 90, 30d supply, fill #2

## 2021-05-30 MED ORDER — LOSARTAN POTASSIUM 100 MG PO TABS
100.0000 mg | ORAL_TABLET | Freq: Every day | ORAL | 1 refills | Status: DC
  Filled 2021-05-30: qty 30, 30d supply, fill #0
  Filled 2021-07-04: qty 30, 30d supply, fill #1
  Filled 2021-08-08: qty 30, 30d supply, fill #2
  Filled 2021-09-08: qty 30, 30d supply, fill #3

## 2021-05-30 MED ORDER — IBUPROFEN 600 MG PO TABS
600.0000 mg | ORAL_TABLET | Freq: Three times a day (TID) | ORAL | 0 refills | Status: DC | PRN
  Filled 2021-05-30: qty 30, 10d supply, fill #0

## 2021-05-30 NOTE — Progress Notes (Signed)
Patient has eaten and taken medication today. ?Patient reports pain in the gums from swelling with Left side more prominent. Patient request medication for this concern. ? ?

## 2021-05-30 NOTE — Patient Instructions (Addendum)
We will call you with today's lab results when they are available. ? ?Roney Jaffe, PA-C ?Physician Assistant ?[facility] ?[email or web address] ? ?Health Maintenance, Female ?Adopting a healthy lifestyle and getting preventive care are important in promoting health and wellness. Ask your health care provider about: ?The right schedule for you to have regular tests and exams. ?Things you can do on your own to prevent diseases and keep yourself healthy. ?What should I know about diet, weight, and exercise? ?Eat a healthy diet ? ?Eat a diet that includes plenty of vegetables, fruits, low-fat dairy products, and lean protein. ?Do not eat a lot of foods that are high in solid fats, added sugars, or sodium. ?Maintain a healthy weight ?Body mass index (BMI) is used to identify weight problems. It estimates body fat based on height and weight. Your health care provider can help determine your BMI and help you achieve or maintain a healthy weight. ?Get regular exercise ?Get regular exercise. This is one of the most important things you can do for your health. Most adults should: ?Exercise for at least 150 minutes each week. The exercise should increase your heart rate and make you sweat (moderate-intensity exercise). ?Do strengthening exercises at least twice a week. This is in addition to the moderate-intensity exercise. ?Spend less time sitting. Even light physical activity can be beneficial. ?Watch cholesterol and blood lipids ?Have your blood tested for lipids and cholesterol at 53 years of age, then have this test every 5 years. ?Have your cholesterol levels checked more often if: ?Your lipid or cholesterol levels are high. ?You are older than 53 years of age. ?You are at high risk for heart disease. ?What should I know about cancer screening? ?Depending on your health history and family history, you may need to have cancer screening at various ages. This may  include screening for: ?Breast cancer. ?Cervical cancer. ?Colorectal cancer. ?Skin cancer. ?Lung cancer. ?What should I know about heart disease, diabetes, and high blood pressure? ?Blood pressure and heart disease ?High blood pressure causes heart disease and increases the risk of stroke. This is more likely to develop in people who have high blood pressure readings or are overweight. ?Have your blood pressure checked: ?Every 3-5 years if you are 42-25 years of age. ?Every year if you are 48 years old or older. ?Diabetes ?Have regular diabetes screenings. This checks your fasting blood sugar level. Have the screening done: ?Once every three years after age 14 if you are at a normal weight and have a low risk for diabetes. ?More often and at a younger age if you are overweight or have a high risk for diabetes. ?What should I know about preventing infection? ?Hepatitis B ?If you have a higher risk for hepatitis B, you should be screened for this virus. Talk with your health care provider to find out if you are at risk for hepatitis B infection. ?Hepatitis C ?Testing is recommended for: ?Everyone born from [birth year] through 1967. ?Anyone with known risk factors for hepatitis C. ?Sexually transmitted infections (STIs) ?Get screened for STIs, including gonorrhea and chlamydia, if: ?You are sexually active and are younger than 53 years of age. ?You are older than 53 years of age and your health care provider tells you that you are at risk for this type of infection. ?Your sexual activity has changed since you were last screened, and you are at increased risk for chlamydia or gonorrhea. Ask your health care provider if you are at risk. ?Ask your  health care provider about whether you are at high risk for HIV. Your health care provider may recommend a prescription medicine to help prevent HIV infection. If you choose to take medicine to prevent HIV, you should first get tested for HIV. You should then be tested every 3 months  for as long as you are taking the medicine. ?Pregnancy ?If you are about to stop having your period (premenopausal) and you may become pregnant, seek counseling before you get pregnant. ?Take 400 to 800 micrograms (mcg) of folic acid every day if you become pregnant. ?Ask for birth control (contraception) if you want to prevent pregnancy. ?Osteoporosis and menopause ?Osteoporosis is a disease in which the bones lose minerals and strength with aging. This can result in bone fractures. If you are 24 years old or older, or if you are at risk for osteoporosis and fractures, ask your health care provider if you should: ?Be screened for bone loss. ?Take a calcium or vitamin D supplement to lower your risk of fractures. ?Be given hormone replacement therapy (HRT) to treat symptoms of menopause. ?Follow these instructions at home: ?Alcohol use ?Do not drink alcohol if: ?Your health care provider tells you not to drink. ?You are pregnant, may be pregnant, or are planning to become pregnant. ?If you drink alcohol: ?Limit how much you have to: ?0-1 drink a day. ?Know how much alcohol is in your drink. In the [place] one drink equals one 12 oz bottle of beer (355 mL), one 5 oz glass of wine (148 mL), or one 1? oz glass of hard liquor (44 mL). ?Lifestyle ?Do not use any products that contain nicotine or tobacco. These products include cigarettes, chewing tobacco, and vaping devices, such as e-cigarettes. If you need help quitting, ask your health care provider. ?Do not use street drugs. ?Do not share needles. ?Ask your health care provider for help if you need support or information about quitting drugs. ?General instructions ?Schedule regular health, dental, and eye exams. ?Stay current with your vaccines. ?Tell your health care provider if: ?You often feel depressed. ?You have ever been abused or do not feel safe at home. ?Summary ?Adopting a healthy lifestyle and getting preventive care are important in promoting health and  wellness. ?Follow your health care provider's instructions about healthy diet, exercising, and getting tested or screened for diseases. ?Follow your health care provider's instructions on monitoring your cholesterol and blood pressure. ?This information is not intended to replace advice given to you by your health care provider. Make sure you discuss any questions you have with your health care provider. ?Document Revised: 05/31/2020 Document Reviewed: 05/31/2020 ?Elsevier Patient Education ? 2023 Elsevier Inc. ? ? ?

## 2021-05-30 NOTE — Progress Notes (Signed)
? ?Established Patient Office Visit ? ?Subjective   ?Patient ID: Ashlee Farmer, female    DOB: [birth year]  Age: 51 y.o. MRN: [id number] ? ?Chief Complaint  ?Patient presents with  ? Medication Refill  ?  HTN  ? ? ?Reports that she has been checking her blood pressure at home, states that she uses a wrist blood pressure cuff, states that her blood pressure readings have been within normal limits.  Similar to today's reading. ? ?States that she has not been feeling well overall, attributes it to inflammation in her gums and her upper mouth.  States that this does occur on occasion, when it does it does cause her to feel poorly overall as well as having jaw pain.  Denies gum bleeding.  Unable to follow-up with dentistry at this time due to financial constraints.   ? ? ? ?Past Medical History:  ?Diagnosis Date  ? Allergy   ? Anxiety   ? Depression   ? Endometriosis of bladder   ? Heart murmur   ? IBS (irritable bowel syndrome)   ? ?Social History  ? ?Socioeconomic History  ? Marital status: Divorced  ?  Spouse name: Not on file  ? Number of children: Not on file  ? Years of education: Not on file  ? Highest education level: Not on file  ?Occupational History  ? Not on file  ?Tobacco Use  ? Smoking status: Never  ? Smokeless tobacco: Never  ?Vaping Use  ? Vaping Use: Never used  ?Substance and Sexual Activity  ? Alcohol use: Not Currently  ? Drug use: No  ? Sexual activity: Not Currently  ?Other Topics Concern  ? Not on file  ?Social History Narrative  ? Not on file  ? ?Social Determinants of Health  ? ?Financial Resource Strain: Not on file  ?Food Insecurity: Not on file  ?Transportation Needs: Not on file  ?Physical Activity: Not on file  ?Stress: Not on file  ?Social Connections: Not on file  ?Intimate Partner Violence: Not on file  ? ?Family History  ?Problem Relation Age of Onset  ? Diabetes Mother   ? Hyperlipidemia Mother   ? Hypertension Mother   ? Diabetes Father   ? Heart disease Father   ? Hyperlipidemia  Father   ? Hypertension Father   ? Hyperlipidemia Brother   ? Hypertension Brother   ? ?No Known Allergies ?  ? ?Review of Systems  ?Constitutional:  Negative for chills and fever.  ?HENT:  Negative for congestion, sinus pain and sore throat.   ?Eyes: Negative.   ?Respiratory:  Negative for shortness of breath.   ?Cardiovascular:  Negative for chest pain.  ?Gastrointestinal: Negative.   ?Genitourinary: Negative.   ?Musculoskeletal: Negative.   ?Skin: Negative.   ?Neurological: Negative.   ?Endo/Heme/Allergies: Negative.   ?Psychiatric/Behavioral: Negative.    ? ?  ?Objective:  ?  ? ?BP 136/87 (BP Location: Right Arm, Patient Position: Sitting, Cuff Size: Normal)   Pulse (!) 104   Temp 98.2 ?F (36.8 ?C) (Oral)   Resp 18   Ht 5\' 1"  (1.549 m)   Wt 292 lb (132.5 kg)   LMP 06/17/2015   SpO2 94%   BMI 55.17 kg/m?  ?Wt Readings from Last 3 Encounters:  ?05/30/21 292 lb (132.5 kg)  ?11/03/20 255 lb 6.4 oz (115.8 kg)  ?10/13/20 256 lb 3.2 oz (116.2 kg)  ? ?  ? ?Physical Exam ?Vitals and nursing note reviewed.  ?Constitutional:   ?   General:  She is not in acute distress. ?   Appearance: Normal appearance. She is obese. She is not ill-appearing.  ?HENT:  ?   Head: Normocephalic and atraumatic.  ?   Right Ear: External ear normal.  ?   Left Ear: External ear normal.  ?   Nose: Nose normal.  ?   Mouth/Throat:  ?   Mouth: Mucous membranes are moist.  ?   Dentition: Gingival swelling present.  ?   Pharynx: Oropharynx is clear.  ?Eyes:  ?   Conjunctiva/sclera: Conjunctivae normal.  ?   Pupils: Pupils are equal, round, and reactive to light.  ?Cardiovascular:  ?   Rate and Rhythm: Normal rate and regular rhythm.  ?   Pulses: Normal pulses.  ?   Heart sounds: Normal heart sounds.  ?Pulmonary:  ?   Effort: Pulmonary effort is normal.  ?   Breath sounds: Normal breath sounds.  ?Musculoskeletal:     ?   General: Normal range of motion.  ?   Cervical back: Normal range of motion.  ?Skin: ?   General: Skin is warm and dry.   ?Neurological:  ?   General: No focal deficit present.  ?   Mental Status: She is alert and oriented to person, place, and time.  ?Psychiatric:     ?   Mood and Affect: Mood normal.     ?   Behavior: Behavior normal.     ?   Thought Content: Thought content normal.     ?   Judgment: Judgment normal.  ? ? ? ?  ?Assessment & Plan:  ? ?Problem List Items Addressed This Visit   ? ?  ? Cardiovascular and Mediastinum  ? Essential hypertension - Primary  ? Relevant Medications  ? hydrALAZINE (APRESOLINE) 25 MG tablet  ? losartan (COZAAR) 100 MG tablet  ? simvastatin (ZOCOR) 20 MG tablet  ? amLODipine (NORVASC) 10 MG tablet  ? Other Relevant Orders  ? Comp. Metabolic Panel (12) (Completed)  ?  ? Other  ? Class 3 severe obesity due to excess calories with serious comorbidity and body mass index (BMI) of 50.0 to 59.9 in adult [facility](HCC)  ? Pain, dental  ? Relevant Medications  ? amoxicillin (AMOXIL) 500 MG capsule  ? ibuprofen (ADVIL) 600 MG tablet  ? ?Other Visit Diagnoses   ? ? Encounter for screening mammogram for malignant neoplasm of breast      ? Relevant Orders  ? MM DIGITAL SCREENING BILATERAL  ? Encounter for HCV screening test for low risk patient      ? Relevant Orders  ? HCV Ab w Reflex to Quant PCR (Completed)  ? ?  ?1. Essential hypertension ?Continue current regimen.  Patient encouraged to continue checking blood pressure at home, keep a written log and have available for all office visits.  Red flags given for prompt reevaluation. ?- Comp. Metabolic Panel (12) ?- hydrALAZINE (APRESOLINE) 25 MG tablet; Take 1 tablet (25 mg total) by mouth every 8 (eight) hours.  Dispense: 90 tablet; Refill: 2 ?- losartan (COZAAR) 100 MG tablet; Take 1 tablet (100 mg total) by mouth daily.  Dispense: 90 tablet; Refill: 1 ?- simvastatin (ZOCOR) 20 MG tablet; Take 1 tablet (20 mg total) by mouth daily at 6 PM.  Dispense: 90 tablet; Refill: 1 ?- amLODipine (NORVASC) 10 MG tablet; Take 1 tablet (10 mg total) by mouth daily.  Dispense:  90 tablet; Refill: 1 ? ?2. Pain, dental ?Trial amoxicillin, Advil.  Patient encouraged to follow-up  with dentistry as soon as able. ?- amoxicillin (AMOXIL) 500 MG capsule; Take 1 capsule (500 mg total) by mouth 3 (three) times daily for 10 days.  Dispense: 30 capsule; Refill: 0 ?- ibuprofen (ADVIL) 600 MG tablet; Take 1 tablet (600 mg total) by mouth every 8 (eight) hours as needed.  Dispense: 30 tablet; Refill: 0 ? ?3. Class 3 severe obesity due to excess calories with serious comorbidity and body mass index (BMI) of 50.0 to 59.9 in adult [facility]) ? ? ?4. Encounter for screening mammogram for malignant neoplasm of breast ?Scholarship application filled out on patient's behalf ?- MM DIGITAL SCREENING BILATERAL; Future ? ?5. Encounter for HCV screening test for low risk patient ? ?- HCV Ab w Reflex to Quant PCR ? ? ? ?I have reviewed the patient's medical history (PMH, PSH, Social History, Family History, Medications, and allergies) , and have been updated if relevant. I spent 30 minutes reviewing chart and  face to face time with patient. ? ? ? ?Return in about 3 months (around 08/30/2021) for with Dr. Andrey Campanile at [facility].  ? ? ?Stephanine Reas S Mayers, PA-C ? ?

## 2021-05-31 LAB — COMP. METABOLIC PANEL (12)
AST: 17 IU/L (ref 0–40)
Albumin/Globulin Ratio: 1.2 (ref 1.2–2.2)
Albumin: 4.6 g/dL (ref 3.8–4.9)
Alkaline Phosphatase: 88 IU/L (ref 44–121)
BUN/Creatinine Ratio: 20 (ref 9–23)
BUN: 20 mg/dL (ref 6–24)
Bilirubin Total: 0.5 mg/dL (ref 0.0–1.2)
Calcium: 9.7 mg/dL (ref 8.7–10.2)
Chloride: 102 mmol/L (ref 96–106)
Creatinine, Ser: 1.02 mg/dL — ABNORMAL HIGH (ref 0.57–1.00)
Globulin, Total: 3.7 g/dL (ref 1.5–4.5)
Glucose: 100 mg/dL — ABNORMAL HIGH (ref 70–99)
Potassium: 4.3 mmol/L (ref 3.5–5.2)
Sodium: 141 mmol/L (ref 134–144)
Total Protein: 8.3 g/dL (ref 6.0–8.5)
eGFR: 66 mL/min/{1.73_m2} (ref >59)

## 2021-05-31 LAB — HCV AB W REFLEX TO QUANT PCR: HCV Ab: NONREACTIVE

## 2021-05-31 LAB — HCV INTERPRETATION

## 2021-06-01 ENCOUNTER — Telehealth: Payer: Self-pay | Admitting: *Deleted

## 2021-06-01 NOTE — Telephone Encounter (Signed)
Patient verified DOB ?Patient is aware of labs being normal and needing to increase water intake. Patient will complete antibiotics for swollen gums concern. ?

## 2021-06-07 ENCOUNTER — Other Ambulatory Visit: Payer: Self-pay

## 2021-06-10 ENCOUNTER — Other Ambulatory Visit: Payer: Self-pay

## 2021-07-04 ENCOUNTER — Other Ambulatory Visit: Payer: Self-pay | Admitting: Family Medicine

## 2021-07-04 ENCOUNTER — Other Ambulatory Visit: Payer: Self-pay

## 2021-07-04 MED ORDER — PANTOPRAZOLE SODIUM 40 MG PO TBEC
40.0000 mg | DELAYED_RELEASE_TABLET | Freq: Two times a day (BID) | ORAL | 0 refills | Status: DC
  Filled 2021-07-04: qty 60, 30d supply, fill #0

## 2021-07-18 ENCOUNTER — Other Ambulatory Visit: Payer: Self-pay

## 2021-07-22 ENCOUNTER — Other Ambulatory Visit: Payer: Self-pay

## 2021-07-25 ENCOUNTER — Other Ambulatory Visit: Payer: Self-pay

## 2021-08-08 ENCOUNTER — Other Ambulatory Visit: Payer: Self-pay

## 2021-08-10 ENCOUNTER — Other Ambulatory Visit: Payer: Self-pay

## 2021-08-30 ENCOUNTER — Ambulatory Visit: Payer: Self-pay | Admitting: Family Medicine

## 2021-09-08 ENCOUNTER — Other Ambulatory Visit: Payer: Self-pay | Admitting: Family Medicine

## 2021-09-08 ENCOUNTER — Other Ambulatory Visit: Payer: Self-pay

## 2021-09-09 ENCOUNTER — Other Ambulatory Visit: Payer: Self-pay

## 2021-09-09 MED ORDER — PANTOPRAZOLE SODIUM 40 MG PO TBEC
40.0000 mg | DELAYED_RELEASE_TABLET | Freq: Two times a day (BID) | ORAL | 0 refills | Status: DC
Start: 2021-09-09 — End: 2021-09-21
  Filled 2021-09-09: qty 180, 90d supply, fill #0

## 2021-09-12 ENCOUNTER — Other Ambulatory Visit: Payer: Self-pay

## 2021-09-21 ENCOUNTER — Ambulatory Visit (INDEPENDENT_AMBULATORY_CARE_PROVIDER_SITE_OTHER): Payer: Self-pay | Admitting: Family Medicine

## 2021-09-21 ENCOUNTER — Encounter: Payer: Self-pay | Admitting: Family Medicine

## 2021-09-21 ENCOUNTER — Other Ambulatory Visit: Payer: Self-pay

## 2021-09-21 VITALS — BP 114/75 | HR 108 | Temp 97.7°F | Resp 16 | Wt 290.2 lb

## 2021-09-21 DIAGNOSIS — K219 Gastro-esophageal reflux disease without esophagitis: Secondary | ICD-10-CM

## 2021-09-21 DIAGNOSIS — I10 Essential (primary) hypertension: Secondary | ICD-10-CM

## 2021-09-21 MED ORDER — SIMVASTATIN 20 MG PO TABS
20.0000 mg | ORAL_TABLET | Freq: Every day | ORAL | 1 refills | Status: DC
  Filled 2021-09-21: qty 90, 90d supply, fill #0
  Filled 2021-10-21: qty 90, 30d supply, fill #0
  Filled 2022-02-07: qty 90, 90d supply, fill #1

## 2021-09-21 MED ORDER — AMLODIPINE BESYLATE 10 MG PO TABS
10.0000 mg | ORAL_TABLET | Freq: Every day | ORAL | 1 refills | Status: DC
  Filled 2021-09-21: qty 90, 90d supply, fill #0
  Filled 2021-10-21: qty 30, 30d supply, fill #0
  Filled 2021-12-01 – 2021-12-08 (×2): qty 30, 30d supply, fill #1
  Filled 2022-01-10 – 2022-02-10 (×3): qty 30, 30d supply, fill #2
  Filled 2022-02-10: qty 90, 90d supply, fill #2

## 2021-09-21 MED ORDER — HYDRALAZINE HCL 25 MG PO TABS
25.0000 mg | ORAL_TABLET | Freq: Three times a day (TID) | ORAL | 1 refills | Status: DC
  Filled 2021-09-21 – 2021-10-21 (×2): qty 270, 90d supply, fill #0

## 2021-09-21 MED ORDER — PANTOPRAZOLE SODIUM 40 MG PO TBEC
40.0000 mg | DELAYED_RELEASE_TABLET | Freq: Two times a day (BID) | ORAL | 1 refills | Status: DC
  Filled 2021-09-21: qty 180, 90d supply, fill #0

## 2021-09-21 MED ORDER — LOSARTAN POTASSIUM 100 MG PO TABS
100.0000 mg | ORAL_TABLET | Freq: Every day | ORAL | 1 refills | Status: DC
  Filled 2021-09-21 – 2021-10-21 (×2): qty 90, 90d supply, fill #0
  Filled 2022-01-24: qty 30, 30d supply, fill #1
  Filled 2022-02-10 (×2): qty 90, 90d supply, fill #1

## 2021-09-22 NOTE — Progress Notes (Signed)
Established Patient Office Visit  Subjective    Patient ID: Ashlee Farmer, female    DOB: Dec 31, 1970  Age: 51 y.o. MRN: [id number]  CC:  Chief Complaint  Patient presents with   Follow-up    HPI [name] presents for routine follow up of chronic med issues. Patient denies acute complaints or concerns.    Outpatient Encounter Medications as of 09/21/2021  Medication Sig   amLODipine (NORVASC) 10 MG tablet Take 1 tablet (10 mg total) by mouth daily.   hydrALAZINE (APRESOLINE) 25 MG tablet Take 1 tablet (25 mg total) by mouth every 8 (eight) hours.   ibuprofen (ADVIL) 600 MG tablet Take 1 tablet (600 mg total) by mouth every 8 (eight) hours as needed.   losartan (COZAAR) 100 MG tablet Take 1 tablet (100 mg total) by mouth daily.   pantoprazole (PROTONIX) 40 MG tablet Take 1 tablet (40 mg total) by mouth 2 (two) times daily.   simvastatin (ZOCOR) 20 MG tablet Take 1 tablet (20 mg total) by mouth once daily at 6 PM.   [DISCONTINUED] amLODipine (NORVASC) 10 MG tablet Take 1 tablet (10 mg total) by mouth daily.   [DISCONTINUED] hydrALAZINE (APRESOLINE) 25 MG tablet Take 1 tablet (25 mg total) by mouth every 8 (eight) hours.   [DISCONTINUED] losartan (COZAAR) 100 MG tablet Take 1 tablet (100 mg total) by mouth daily.   [DISCONTINUED] pantoprazole (PROTONIX) 40 MG tablet Take 1 tablet (40 mg total) by mouth 2 (two) times daily.   [DISCONTINUED] simvastatin (ZOCOR) 20 MG tablet Take 1 tablet (20 mg total) by mouth once daily at 6 PM.   No facility-administered encounter medications on file as of 09/21/2021.    Past Medical History:  Diagnosis Date   Allergy    Anxiety    Depression    Endometriosis of bladder    Heart murmur    IBS (irritable bowel syndrome)     Past Surgical History:  Procedure Laterality Date   ABLATION ON ENDOMETRIOSIS      Family History  Problem Relation Age of Onset   Diabetes Mother    Hyperlipidemia Mother    Hypertension Mother    Diabetes  Father    Heart disease Father    Hyperlipidemia Father    Hypertension Father    Hyperlipidemia Brother    Hypertension Brother     Social History   Socioeconomic History   Marital status: Divorced    Spouse name: Not on file   Number of children: Not on file   Years of education: Not on file   Highest education level: Not on file  Occupational History   Not on file  Tobacco Use   Smoking status: Never   Smokeless tobacco: Never  Vaping Use   Vaping Use: Never used  Substance and Sexual Activity   Alcohol use: Not Currently   Drug use: No   Sexual activity: Not Currently  Other Topics Concern   Not on file  Social History Narrative   Not on file   Social Determinants of Health   Financial Resource Strain: Not on file  Food Insecurity: Not on file  Transportation Needs: Not on file  Physical Activity: Not on file  Stress: Not on file  Social Connections: Not on file  Intimate Partner Violence: Not on file    Review of Systems  All other systems reviewed and are negative.       Objective    BP 114/75   Pulse Marland Kitchen)  108   Temp 97.7 F (36.5 C) (Oral)   Resp 16   Wt 290 lb 3.2 oz (131.6 kg)   LMP 06/17/2015   SpO2 96%   BMI 54.83 kg/m   Physical Exam Vitals and nursing note reviewed.  Constitutional:      General: She is not in acute distress.    Appearance: She is obese.  Cardiovascular:     Rate and Rhythm: Normal rate and regular rhythm.  Pulmonary:     Effort: Pulmonary effort is normal.     Breath sounds: Normal breath sounds.  Abdominal:     Palpations: Abdomen is soft.     Tenderness: There is no abdominal tenderness.  Neurological:     General: No focal deficit present.     Mental Status: She is alert and oriented to person, place, and time.         Assessment & Plan:   1. Essential hypertension Appears stable. Continue and monitor. Meds refilled.  - amLODipine (NORVASC) 10 MG tablet; Take 1 tablet (10 mg total) by mouth  daily.  Dispense: 90 tablet; Refill: 1 - simvastatin (ZOCOR) 20 MG tablet; Take 1 tablet (20 mg total) by mouth once daily at 6 PM.  Dispense: 90 tablet; Refill: 1 - losartan (COZAAR) 100 MG tablet; Take 1 tablet (100 mg total) by mouth daily.  Dispense: 90 tablet; Refill: 1 - hydrALAZINE (APRESOLINE) 25 MG tablet; Take 1 tablet (25 mg total) by mouth every 8 (eight) hours.  Dispense: 270 tablet; Refill: 1  2. Gastroesophageal reflux disease without esophagitis Discussed dietary and activity options. Continue and monitor    Return in about 6 months (around 03/23/2022) for follow up.   Tommie Raymond, MD

## 2021-10-21 ENCOUNTER — Other Ambulatory Visit: Payer: Self-pay

## 2021-10-26 ENCOUNTER — Other Ambulatory Visit: Payer: Self-pay

## 2021-11-10 ENCOUNTER — Other Ambulatory Visit: Payer: Self-pay | Admitting: Family Medicine

## 2021-11-10 DIAGNOSIS — Z1231 Encounter for screening mammogram for malignant neoplasm of breast: Secondary | ICD-10-CM

## 2021-11-12 ENCOUNTER — Inpatient Hospital Stay: Admission: RE | Admit: 2021-11-12 | Payer: Self-pay | Source: Ambulatory Visit

## 2021-12-01 ENCOUNTER — Other Ambulatory Visit: Payer: Self-pay

## 2021-12-07 ENCOUNTER — Other Ambulatory Visit: Payer: Self-pay

## 2021-12-08 ENCOUNTER — Other Ambulatory Visit: Payer: Self-pay

## 2021-12-09 ENCOUNTER — Other Ambulatory Visit: Payer: Self-pay

## 2022-01-10 ENCOUNTER — Other Ambulatory Visit: Payer: Self-pay

## 2022-01-17 ENCOUNTER — Other Ambulatory Visit: Payer: Self-pay

## 2022-01-24 ENCOUNTER — Other Ambulatory Visit: Payer: Self-pay

## 2022-02-07 ENCOUNTER — Other Ambulatory Visit: Payer: Self-pay

## 2022-02-10 ENCOUNTER — Other Ambulatory Visit: Payer: Self-pay

## 2022-02-23 DIAGNOSIS — Z419 Encounter for procedure for purposes other than remedying health state, unspecified: Secondary | ICD-10-CM | POA: Diagnosis not present

## 2022-03-23 ENCOUNTER — Ambulatory Visit: Payer: Medicaid Other | Admitting: Family Medicine

## 2022-03-24 DIAGNOSIS — Z419 Encounter for procedure for purposes other than remedying health state, unspecified: Secondary | ICD-10-CM | POA: Diagnosis not present

## 2022-04-12 ENCOUNTER — Ambulatory Visit (INDEPENDENT_AMBULATORY_CARE_PROVIDER_SITE_OTHER): Payer: Medicaid Other | Admitting: Family Medicine

## 2022-04-12 ENCOUNTER — Other Ambulatory Visit: Payer: Self-pay

## 2022-04-12 ENCOUNTER — Encounter: Payer: Self-pay | Admitting: Family Medicine

## 2022-04-12 VITALS — BP 153/87 | HR 99 | Temp 98.1°F | Resp 16 | Wt 297.4 lb

## 2022-04-12 DIAGNOSIS — I10 Essential (primary) hypertension: Secondary | ICD-10-CM

## 2022-04-12 DIAGNOSIS — Z6841 Body Mass Index (BMI) 40.0 and over, adult: Secondary | ICD-10-CM

## 2022-04-12 DIAGNOSIS — K219 Gastro-esophageal reflux disease without esophagitis: Secondary | ICD-10-CM

## 2022-04-12 DIAGNOSIS — E785 Hyperlipidemia, unspecified: Secondary | ICD-10-CM | POA: Diagnosis not present

## 2022-04-12 MED ORDER — PANTOPRAZOLE SODIUM 40 MG PO TBEC
40.0000 mg | DELAYED_RELEASE_TABLET | Freq: Two times a day (BID) | ORAL | 1 refills | Status: DC
  Filled 2022-04-12: qty 180, 90d supply, fill #0
  Filled 2022-05-08: qty 60, 30d supply, fill #0
  Filled 2022-08-18: qty 60, 30d supply, fill #1

## 2022-04-12 MED ORDER — HYDRALAZINE HCL 25 MG PO TABS
25.0000 mg | ORAL_TABLET | Freq: Three times a day (TID) | ORAL | 1 refills | Status: DC
  Filled 2022-04-12: qty 239, 80d supply, fill #0
  Filled 2022-04-12: qty 31, 10d supply, fill #0
  Filled 2022-05-08: qty 270, 90d supply, fill #0
  Filled 2022-08-18: qty 270, 90d supply, fill #1

## 2022-04-12 MED ORDER — SIMVASTATIN 20 MG PO TABS
20.0000 mg | ORAL_TABLET | Freq: Every day | ORAL | 1 refills | Status: DC
  Filled 2022-04-12 – 2022-05-08 (×2): qty 90, 90d supply, fill #0
  Filled 2022-08-18: qty 90, 90d supply, fill #1

## 2022-04-12 MED ORDER — AMLODIPINE BESYLATE 10 MG PO TABS
10.0000 mg | ORAL_TABLET | Freq: Every day | ORAL | 1 refills | Status: DC
  Filled 2022-04-12 – 2022-05-08 (×2): qty 90, 90d supply, fill #0
  Filled 2022-08-18: qty 90, 90d supply, fill #1

## 2022-04-12 MED ORDER — LOSARTAN POTASSIUM 100 MG PO TABS
100.0000 mg | ORAL_TABLET | Freq: Every day | ORAL | 1 refills | Status: DC
  Filled 2022-04-12 – 2022-05-08 (×2): qty 90, 90d supply, fill #0
  Filled 2022-08-18: qty 90, 90d supply, fill #1

## 2022-04-12 NOTE — Progress Notes (Signed)
Patient is here for their 6 month follow-up Patient has no concerns today Care gaps have been discussed with patient  

## 2022-04-12 NOTE — Progress Notes (Signed)
Established Patient Office Visit  Subjective    Patient ID: Ashlee Farmer, female    DOB: [birth year]  Age: 52 y.o. MRN: [id number]  CC:  Chief Complaint  Patient presents with   Follow-up   Hypertension    6 month    HPI MAKELA CARREAU presents for routine follow up of chronic med issues. Patient denies acute complaints or concerns.    Outpatient Encounter Medications as of 04/12/2022  Medication Sig   amLODipine (NORVASC) 10 MG tablet Take 1 tablet (10 mg total) by mouth daily.   hydrALAZINE (APRESOLINE) 25 MG tablet Take 1 tablet (25 mg total) by mouth every 8 (eight) hours.   ibuprofen (ADVIL) 600 MG tablet Take 1 tablet (600 mg total) by mouth every 8 (eight) hours as needed.   losartan (COZAAR) 100 MG tablet Take 1 tablet (100 mg total) by mouth daily.   pantoprazole (PROTONIX) 40 MG tablet Take 1 tablet (40 mg total) by mouth 2 (two) times daily.   simvastatin (ZOCOR) 20 MG tablet Take 1 tablet (20 mg total) by mouth once daily at 6 PM.   [DISCONTINUED] amLODipine (NORVASC) 10 MG tablet Take 1 tablet (10 mg total) by mouth daily.   [DISCONTINUED] hydrALAZINE (APRESOLINE) 25 MG tablet Take 1 tablet (25 mg total) by mouth every 8 (eight) hours.   [DISCONTINUED] losartan (COZAAR) 100 MG tablet Take 1 tablet (100 mg total) by mouth daily.   [DISCONTINUED] pantoprazole (PROTONIX) 40 MG tablet Take 1 tablet (40 mg total) by mouth 2 (two) times daily.   [DISCONTINUED] simvastatin (ZOCOR) 20 MG tablet Take 1 tablet (20 mg total) by mouth once daily at 6 PM.   No facility-administered encounter medications on file as of 04/12/2022.    Past Medical History:  Diagnosis Date   Allergy    Anxiety    Depression    Endometriosis of bladder    Heart murmur    IBS (irritable bowel syndrome)     Past Surgical History:  Procedure Laterality Date   ABLATION ON ENDOMETRIOSIS      Family History  Problem Relation Age of Onset   Diabetes Mother    Hyperlipidemia Mother     Hypertension Mother    Diabetes Father    Heart disease Father    Hyperlipidemia Father    Hypertension Father    Hyperlipidemia Brother    Hypertension Brother     Social History   Socioeconomic History   Marital status: Divorced    Spouse name: Not on file   Number of children: Not on file   Years of education: Not on file   Highest education level: Not on file  Occupational History   Not on file  Tobacco Use   Smoking status: Never   Smokeless tobacco: Never  Vaping Use   Vaping Use: Never used  Substance and Sexual Activity   Alcohol use: Not Currently   Drug use: No   Sexual activity: Not Currently  Other Topics Concern   Not on file  Social History Narrative   Not on file   Social Determinants of Health   Financial Resource Strain: Not on file  Food Insecurity: Not on file  Transportation Needs: Not on file  Physical Activity: Not on file  Stress: Not on file  Social Connections: Not on file  Intimate Partner Violence: Not on file    Review of Systems  All other systems reviewed and are negative.       Objective  BP (!) 153/87   Pulse 99   Temp 98.1 F (36.7 C) (Oral)   Resp 16   Wt 297 lb 6.4 oz (134.9 kg)   LMP 06/17/2015   SpO2 96%   BMI 56.19 kg/m   Physical Exam Vitals and nursing note reviewed.  Constitutional:      General: She is not in acute distress.    Appearance: She is obese.  Cardiovascular:     Rate and Rhythm: Normal rate and regular rhythm.  Pulmonary:     Effort: Pulmonary effort is normal.     Breath sounds: Normal breath sounds.  Abdominal:     Palpations: Abdomen is soft.     Tenderness: There is no abdominal tenderness.  Neurological:     General: No focal deficit present.     Mental Status: She is alert and oriented to person, place, and time.         Assessment & Plan:   1. Essential hypertension Slightly elevated readings. Continue. Meds refilled - simvastatin (ZOCOR) 20 MG tablet; Take 1 tablet  (20 mg total) by mouth once daily at 6 PM.  Dispense: 90 tablet; Refill: 1 - losartan (COZAAR) 100 MG tablet; Take 1 tablet (100 mg total) by mouth daily.  Dispense: 90 tablet; Refill: 1 - hydrALAZINE (APRESOLINE) 25 MG tablet; Take 1 tablet (25 mg total) by mouth every 8 (eight) hours.  Dispense: 270 tablet; Refill: 1 - amLODipine (NORVASC) 10 MG tablet; Take 1 tablet (10 mg total) by mouth daily.  Dispense: 90 tablet; Refill: 1  2. Gastroesophageal reflux disease without esophagitis Discussed dietary options.   3. Class 3 severe obesity due to excess calories with serious comorbidity and body mass index (BMI) of 50.0 to 59.9 in adult [facility]) Discussed dietary and activity options.,  4. Hyperlipidemia, unspecified hyperlipidemia type Continue     Return in about 6 months (around 10/13/2022) for physical, follow up.   Becky Sax, MD

## 2022-04-19 ENCOUNTER — Other Ambulatory Visit: Payer: Self-pay

## 2022-04-24 DIAGNOSIS — Z419 Encounter for procedure for purposes other than remedying health state, unspecified: Secondary | ICD-10-CM | POA: Diagnosis not present

## 2022-05-08 ENCOUNTER — Other Ambulatory Visit: Payer: Self-pay

## 2022-05-11 ENCOUNTER — Other Ambulatory Visit: Payer: Self-pay

## 2022-05-24 DIAGNOSIS — Z419 Encounter for procedure for purposes other than remedying health state, unspecified: Secondary | ICD-10-CM | POA: Diagnosis not present

## 2022-06-24 DIAGNOSIS — Z419 Encounter for procedure for purposes other than remedying health state, unspecified: Secondary | ICD-10-CM | POA: Diagnosis not present

## 2022-07-24 DIAGNOSIS — Z419 Encounter for procedure for purposes other than remedying health state, unspecified: Secondary | ICD-10-CM | POA: Diagnosis not present

## 2022-07-31 ENCOUNTER — Telehealth: Payer: Self-pay

## 2022-07-31 NOTE — Telephone Encounter (Signed)
Chart review completed for patient. Patient is due for screening mammogram. Mychart message sent to patient to inquire about scheduling mammogram.  Raji Glinski, Population Health Specialist.  

## 2022-08-18 ENCOUNTER — Other Ambulatory Visit: Payer: Self-pay

## 2022-08-24 DIAGNOSIS — Z419 Encounter for procedure for purposes other than remedying health state, unspecified: Secondary | ICD-10-CM | POA: Diagnosis not present

## 2022-09-24 DIAGNOSIS — Z419 Encounter for procedure for purposes other than remedying health state, unspecified: Secondary | ICD-10-CM | POA: Diagnosis not present

## 2022-10-16 ENCOUNTER — Ambulatory Visit: Payer: Medicaid Other | Admitting: Family Medicine

## 2022-10-24 DIAGNOSIS — Z419 Encounter for procedure for purposes other than remedying health state, unspecified: Secondary | ICD-10-CM | POA: Diagnosis not present

## 2022-11-07 ENCOUNTER — Ambulatory Visit: Payer: Medicaid Other | Admitting: Family Medicine

## 2022-11-23 ENCOUNTER — Encounter: Payer: Self-pay | Admitting: Family Medicine

## 2022-11-23 ENCOUNTER — Ambulatory Visit: Payer: Medicaid Other | Admitting: Family Medicine

## 2022-11-23 ENCOUNTER — Other Ambulatory Visit: Payer: Self-pay

## 2022-11-23 VITALS — BP 129/72 | HR 91 | Temp 97.9°F | Resp 16 | Ht 61.0 in | Wt 277.6 lb

## 2022-11-23 DIAGNOSIS — E785 Hyperlipidemia, unspecified: Secondary | ICD-10-CM | POA: Diagnosis not present

## 2022-11-23 DIAGNOSIS — Z6841 Body Mass Index (BMI) 40.0 and over, adult: Secondary | ICD-10-CM | POA: Diagnosis not present

## 2022-11-23 DIAGNOSIS — K219 Gastro-esophageal reflux disease without esophagitis: Secondary | ICD-10-CM | POA: Diagnosis not present

## 2022-11-23 DIAGNOSIS — I10 Essential (primary) hypertension: Secondary | ICD-10-CM

## 2022-11-23 DIAGNOSIS — E66813 Obesity, class 3: Secondary | ICD-10-CM | POA: Diagnosis not present

## 2022-11-23 MED ORDER — PANTOPRAZOLE SODIUM 40 MG PO TBEC
40.0000 mg | DELAYED_RELEASE_TABLET | Freq: Two times a day (BID) | ORAL | 1 refills | Status: AC
  Filled 2022-11-23: qty 180, 90d supply, fill #0

## 2022-11-23 MED ORDER — SIMVASTATIN 20 MG PO TABS
20.0000 mg | ORAL_TABLET | Freq: Every day | ORAL | 1 refills | Status: DC
  Filled 2022-11-23: qty 90, 90d supply, fill #0

## 2022-11-23 MED ORDER — HYDRALAZINE HCL 25 MG PO TABS
25.0000 mg | ORAL_TABLET | Freq: Three times a day (TID) | ORAL | 1 refills | Status: DC
  Filled 2022-11-23: qty 270, 90d supply, fill #0

## 2022-11-23 MED ORDER — AMLODIPINE BESYLATE 10 MG PO TABS
10.0000 mg | ORAL_TABLET | Freq: Every day | ORAL | 1 refills | Status: DC
  Filled 2022-11-23: qty 90, 90d supply, fill #0

## 2022-11-23 NOTE — Progress Notes (Signed)
Established Patient Office Visit  Subjective    Patient ID: Ashlee Farmer, female    DOB: 1970-04-01  Age: 52 y.o. MRN: [id number]  CC:  Chief Complaint  Patient presents with   Medical Management of Chronic Issues    HPI Ashlee Farmer presents for routine follow up with med refills of chronic med issues including hypertension and GERD. Patient has been taking meds as recommended and denies acute complaints.   Outpatient Encounter Medications as of 11/23/2022  Medication Sig   amLODipine (NORVASC) 10 MG tablet Take 1 tablet (10 mg total) by mouth daily.   hydrALAZINE (APRESOLINE) 25 MG tablet Take 1 tablet (25 mg total) by mouth every 8 (eight) hours.   ibuprofen (ADVIL) 600 MG tablet Take 1 tablet (600 mg total) by mouth every 8 (eight) hours as needed.   losartan (COZAAR) 100 MG tablet Take 1 tablet (100 mg total) by mouth daily.   pantoprazole (PROTONIX) 40 MG tablet Take 1 tablet (40 mg total) by mouth 2 (two) times daily.   simvastatin (ZOCOR) 20 MG tablet Take 1 tablet (20 mg total) by mouth once daily at 6 PM.   [DISCONTINUED] amLODipine (NORVASC) 10 MG tablet Take 1 tablet (10 mg total) by mouth daily.   [DISCONTINUED] hydrALAZINE (APRESOLINE) 25 MG tablet Take 1 tablet (25 mg total) by mouth every 8 (eight) hours.   [DISCONTINUED] pantoprazole (PROTONIX) 40 MG tablet Take 1 tablet (40 mg total) by mouth 2 (two) times daily.   [DISCONTINUED] simvastatin (ZOCOR) 20 MG tablet Take 1 tablet (20 mg total) by mouth once daily at 6 PM.   No facility-administered encounter medications on file as of 11/23/2022.    Past Medical History:  Diagnosis Date   Allergy    Anxiety    Depression    Endometriosis of bladder    Heart murmur    IBS (irritable bowel syndrome)     Past Surgical History:  Procedure Laterality Date   ABLATION ON ENDOMETRIOSIS      Family History  Problem Relation Age of Onset   Diabetes Mother    Hyperlipidemia Mother    Hypertension Mother     Diabetes Father    Heart disease Father    Hyperlipidemia Father    Hypertension Father    Hyperlipidemia Brother    Hypertension Brother     Social History   Socioeconomic History   Marital status: Divorced    Spouse name: Not on file   Number of children: Not on file   Years of education: Not on file   Highest education level: Not on file  Occupational History   Not on file  Tobacco Use   Smoking status: Never   Smokeless tobacco: Never  Vaping Use   Vaping status: Never Used  Substance and Sexual Activity   Alcohol use: Not Currently   Drug use: No   Sexual activity: Not Currently  Other Topics Concern   Not on file  Social History Narrative   Not on file   Social Determinants of Health   Financial Resource Strain: Not on file  Food Insecurity: Not on file  Transportation Needs: Not on file  Physical Activity: Not on file  Stress: Not on file  Social Connections: Not on file  Intimate Partner Violence: Not on file    Review of Systems  All other systems reviewed and are negative.       Objective    BP 129/72   Pulse 91  Temp 97.9 F (36.6 C) (Oral)   Resp 16   Ht 5\' 1"  (1.549 m)   Wt 277 lb 9.6 oz (125.9 kg)   LMP 06/17/2015   SpO2 95%   BMI 52.45 kg/m   Physical Exam Vitals and nursing note reviewed.  Constitutional:      General: She is not in acute distress.    Appearance: She is obese.  Cardiovascular:     Rate and Rhythm: Normal rate and regular rhythm.  Pulmonary:     Effort: Pulmonary effort is normal.     Breath sounds: Normal breath sounds.  Abdominal:     Palpations: Abdomen is soft.     Tenderness: There is no abdominal tenderness.  Neurological:     General: No focal deficit present.     Mental Status: She is alert and oriented to person, place, and time.         Assessment & Plan:   Essential hypertension -     amLODIPine Besylate; Take 1 tablet (10 mg total) by mouth daily.  Dispense: 90 tablet; Refill: 1 -      hydrALAZINE HCl; Take 1 tablet (25 mg total) by mouth every 8 (eight) hours.  Dispense: 270 tablet; Refill: 1 -     Simvastatin; Take 1 tablet (20 mg total) by mouth once daily at 6 PM.  Dispense: 90 tablet; Refill: 1  Gastroesophageal reflux disease without esophagitis  Hyperlipidemia, unspecified hyperlipidemia type  Class 3 severe obesity due to excess calories with serious comorbidity and body mass index (BMI) of 50.0 to 59.9 in adult [facility])  Other orders -     Pantoprazole Sodium; Take 1 tablet (40 mg total) by mouth 2 (two) times daily.  Dispense: 180 tablet; Refill: 1     Return in about 6 months (around 05/23/2023) for physical, follow up.   Tommie Raymond, MD

## 2022-11-24 DIAGNOSIS — Z419 Encounter for procedure for purposes other than remedying health state, unspecified: Secondary | ICD-10-CM | POA: Diagnosis not present

## 2022-11-28 ENCOUNTER — Other Ambulatory Visit: Payer: Self-pay | Admitting: Family Medicine

## 2022-11-28 ENCOUNTER — Other Ambulatory Visit: Payer: Self-pay

## 2022-11-28 DIAGNOSIS — I10 Essential (primary) hypertension: Secondary | ICD-10-CM

## 2022-11-28 MED ORDER — LOSARTAN POTASSIUM 100 MG PO TABS
100.0000 mg | ORAL_TABLET | Freq: Every day | ORAL | 1 refills | Status: DC
  Filled 2022-11-28: qty 90, 90d supply, fill #0

## 2022-11-30 ENCOUNTER — Other Ambulatory Visit: Payer: Self-pay

## 2022-12-24 DIAGNOSIS — Z419 Encounter for procedure for purposes other than remedying health state, unspecified: Secondary | ICD-10-CM | POA: Diagnosis not present

## 2022-12-29 ENCOUNTER — Encounter (HOSPITAL_COMMUNITY): Payer: Self-pay

## 2022-12-29 ENCOUNTER — Emergency Department (HOSPITAL_COMMUNITY): Payer: Self-pay

## 2022-12-29 ENCOUNTER — Other Ambulatory Visit: Payer: Self-pay

## 2022-12-29 ENCOUNTER — Emergency Department (HOSPITAL_COMMUNITY)
Admission: EM | Admit: 2022-12-29 | Discharge: 2022-12-29 | Disposition: A | Discharge status: Home / Self Care | Payer: Self-pay | Attending: Emergency Medicine | Admitting: Emergency Medicine

## 2022-12-29 DIAGNOSIS — Z743 Need for continuous supervision: Secondary | ICD-10-CM | POA: Diagnosis not present

## 2022-12-29 DIAGNOSIS — X501XXA Overexertion from prolonged static or awkward postures, initial encounter: Secondary | ICD-10-CM | POA: Insufficient documentation

## 2022-12-29 DIAGNOSIS — Y9301 Activity, walking, marching and hiking: Secondary | ICD-10-CM | POA: Insufficient documentation

## 2022-12-29 DIAGNOSIS — W19XXXA Unspecified fall, initial encounter: Secondary | ICD-10-CM | POA: Diagnosis not present

## 2022-12-29 DIAGNOSIS — S82432A Displaced oblique fracture of shaft of left fibula, initial encounter for closed fracture: Secondary | ICD-10-CM | POA: Diagnosis not present

## 2022-12-29 DIAGNOSIS — I10 Essential (primary) hypertension: Secondary | ICD-10-CM | POA: Diagnosis not present

## 2022-12-29 DIAGNOSIS — S8252XA Displaced fracture of medial malleolus of left tibia, initial encounter for closed fracture: Secondary | ICD-10-CM | POA: Diagnosis not present

## 2022-12-29 DIAGNOSIS — S82842A Displaced bimalleolar fracture of left lower leg, initial encounter for closed fracture: Secondary | ICD-10-CM | POA: Insufficient documentation

## 2022-12-29 DIAGNOSIS — R000 Tachycardia, unspecified: Secondary | ICD-10-CM | POA: Diagnosis not present

## 2022-12-29 MED ORDER — IBUPROFEN 800 MG PO TABS
800.0000 mg | ORAL_TABLET | Freq: Once | ORAL | Status: AC
  Administered 2022-12-29: 800 mg via ORAL
  Filled 2022-12-29: qty 1

## 2022-12-29 MED ORDER — ACETAMINOPHEN 325 MG PO TABS
650.0000 mg | ORAL_TABLET | Freq: Once | ORAL | Status: AC
  Administered 2022-12-29: 650 mg via ORAL
  Filled 2022-12-29: qty 2

## 2022-12-29 MED ORDER — ASPIRIN 325 MG PO TABS
325.0000 mg | ORAL_TABLET | Freq: Every day | ORAL | Status: DC
  Administered 2022-12-29: 325 mg via ORAL
  Filled 2022-12-29: qty 1

## 2022-12-29 NOTE — ED Triage Notes (Signed)
BIBA c/o left ankle pain and swelling after twisting on the stairs. Bruising per ems

## 2022-12-29 NOTE — ED Provider Notes (Incomplete)
[facility] EMERGENCY DEPARTMENT AT [facility] Provider Note   CSN: [id number] Arrival date & time: 12/29/22  1743     History {Add pertinent medical, surgical, social history, OB history to HPI:1} Chief Complaint  Patient presents with  . Ankle Pain    Ashlee Farmer is a 52 y.o. female who presents emergency department with chief complaint of left ankle injury.  Patient was walking on the stairs with her socks on when she slipped.  She is not exactly sure of the mechanism of injury but had immediate severe pain in the ankle and unable to bear weight.  She denies numbness or tingling.   Ankle Pain      Home Medications Prior to Admission medications   Medication Sig Start Date End Date Taking? Authorizing Provider  amLODipine (NORVASC) 10 MG tablet Take 1 tablet (10 mg total) by mouth daily. 11/23/22   Georganna Skeans, MD  hydrALAZINE (APRESOLINE) 25 MG tablet Take 1 tablet (25 mg total) by mouth every 8 (eight) hours. 11/23/22   Georganna Skeans, MD  ibuprofen (ADVIL) 600 MG tablet Take 1 tablet (600 mg total) by mouth every 8 (eight) hours as needed. 05/30/21   Mayers, Cari S, PA-C  losartan (COZAAR) 100 MG tablet Take 1 tablet (100 mg total) by mouth daily. 11/28/22   Georganna Skeans, MD  pantoprazole (PROTONIX) 40 MG tablet Take 1 tablet (40 mg total) by mouth 2 (two) times daily. 11/23/22   Georganna Skeans, MD  simvastatin (ZOCOR) 20 MG tablet Take 1 tablet (20 mg total) by mouth once daily at 6 PM. 11/23/22   Georganna Skeans, MD      Allergies    Patient has no known allergies.    Review of Systems   Review of Systems  Physical Exam Updated Vital Signs BP (!) 143/123   Pulse (!) 103   Temp 98 F (36.7 C)   Resp 18   Ht 5\' 1"  (1.549 m)   Wt 125 kg   LMP 06/17/2015   SpO2 100%   BMI 52.07 kg/m  Physical Exam Vitals and nursing note reviewed.  Constitutional:      General: She is not in acute distress.    Appearance: She is well-developed. She is not  diaphoretic.  HENT:     Head: Normocephalic and atraumatic.     Right Ear: External ear normal.     Left Ear: External ear normal.     Nose: Nose normal.     Mouth/Throat:     Mouth: Mucous membranes are moist.  Eyes:     General: No scleral icterus.    Conjunctiva/sclera: Conjunctivae normal.  Cardiovascular:     Rate and Rhythm: Normal rate and regular rhythm.     Heart sounds: Normal heart sounds. No murmur heard.    No friction rub. No gallop.  Pulmonary:     Effort: Pulmonary effort is normal. No respiratory distress.     Breath sounds: Normal breath sounds.  Abdominal:     General: Bowel sounds are normal. There is no distension.     Palpations: Abdomen is soft. There is no mass.     Tenderness: There is no abdominal tenderness. There is no guarding.  Musculoskeletal:     Cervical back: Normal range of motion.     Comments: Left ankle with reduced range of motion, exquisite tenderness bilateral malleolus I.  The Achilles tendon appears to be intact.  DP and PT pulse 2+.  Brisk cap refill.  Skin:    General: Skin is warm and dry.  Neurological:     Mental Status: She is alert and oriented to person, place, and time.  Psychiatric:        Behavior: Behavior normal.     ED Results / Procedures / Treatments   Labs (all labs ordered are listed, but only abnormal results are displayed) Labs Reviewed - No data to display  EKG None  Radiology DG Ankle Complete Left  Result Date: 12/29/2022 CLINICAL DATA:  Fall EXAM: LEFT ANKLE COMPLETE - 3+ VIEW COMPARISON:  None Available. FINDINGS: Transverse fracture noted through the medial malleolus. Oblique fracture through the distal fibula. Fractures are mildly displaced. No subluxation or dislocation. IMPRESSION: Bimalleolar fracture. Electronically Signed   By: Charlett Nose M.D.   On: 12/29/2022 19:02    Procedures Procedures  {Document cardiac monitor, telemetry assessment procedure when appropriate:1}  Medications Ordered  in ED Medications  ibuprofen (ADVIL) tablet 800 mg (800 mg Oral Given 12/29/22 1825)    ED Course/ Medical Decision Making/ A&P Clinical Course as of 12/29/22 1930  Fri Dec 29, 2022  1930 DG Ankle Complete Left I visualized and interpreted the left ankle x-ray which shows bimalleolar fracture.  Appears to be well aligned. [AH]    Clinical Course User Index [AH] Arthor Captain, PA-C   {   Click here for ABCD2, HEART and other calculatorsREFRESH Note before signing :1}                              Medical Decision Making Amount and/or Complexity of Data Reviewed Radiology: ordered.  Risk Prescription drug management.   ***  {Document critical care time when appropriate:1} {Document review of labs and clinical decision tools ie heart score, Chads2Vasc2 etc:1}  {Document your independent review of radiology images, and any outside records:1} {Document your discussion with family members, caretakers, and with consultants:1} {Document social determinants of health affecting pt's care:1} {Document your decision making why or why not admission, treatments were needed:1} Final Clinical Impression(s) / ED Diagnoses Final diagnoses:  None    Rx / DC Orders ED Discharge Orders     None

## 2022-12-29 NOTE — Progress Notes (Signed)
Orthopedic Tech Progress Note Patient Details:  Ashlee Farmer [birth year] [id number]  Ortho Devices Type of Ortho Device: Crutches, Post (short leg) splint, Stirrup splint Ortho Device/Splint Location: LLE Ortho Device/Splint Interventions: Application   Post Interventions Patient Tolerated: Well Instructions Provided: Poper ambulation with device, Care of device  Roderic Lammert E [name] 12/29/2022, 8:49 PM

## 2022-12-29 NOTE — ED Notes (Signed)
Pt refusing DG Sacrum/Coccyx Pt advised she didn't need x ray and that she was fine

## 2022-12-29 NOTE — Discharge Instructions (Signed)
Take 1 full dose aspirinb daily Alternate between tylenol and ibuprofen every 3 hours.  Ice and elevate.   DO NOT BEAR WEIGHT ON THE ANKLE UNTIL YOU FOLLOW UP WITH THE ORTHOPEDIC DOCTOR!  Get help right away if: You develop any symptoms of compartment syndrome, such as: Severe pain or pressure under the cast. Numbness, tingling, coldness, or pale or bluish skin. The part of your body above or below the cast is swollen and discolored. You cannot feel or move your fingers or toes. You have trouble breathing or shortness of breath. You have chest pain. Your pain gets worse.

## 2023-01-01 ENCOUNTER — Telehealth: Payer: Self-pay | Admitting: Family Medicine

## 2023-01-01 NOTE — Telephone Encounter (Signed)
Copied from CRM [id number]. Topic: General - Other >> Jan 01, 2023  2:51 PM Phill Myron wrote: PATIENT calling back to give an updated number [phone]  Called earlier with this message (Reason for CRM: pt called saying she needs a referral for a fractured ankle.  She would like Dr. Andrey Campanile to call her back.)

## 2023-01-03 ENCOUNTER — Ambulatory Visit (HOSPITAL_BASED_OUTPATIENT_CLINIC_OR_DEPARTMENT_OTHER): Payer: Self-pay | Admitting: Student

## 2023-01-03 ENCOUNTER — Telehealth (HOSPITAL_BASED_OUTPATIENT_CLINIC_OR_DEPARTMENT_OTHER): Payer: Self-pay

## 2023-01-03 ENCOUNTER — Encounter (HOSPITAL_BASED_OUTPATIENT_CLINIC_OR_DEPARTMENT_OTHER): Payer: Self-pay | Admitting: Student

## 2023-01-03 DIAGNOSIS — S82842A Displaced bimalleolar fracture of left lower leg, initial encounter for closed fracture: Secondary | ICD-10-CM

## 2023-01-03 NOTE — Telephone Encounter (Signed)
Pt came in to same day clinic for ankle fx. Pt is needing surgery and per Jean Rosenthal needs appointment for tomorrow with Dr. Roda Shutters, who was on call   Please contact sister Diane @ [phone] to discuss this appointment

## 2023-01-03 NOTE — Progress Notes (Signed)
Chief Complaint: Left ankle fracture     History of Present Illness:    Ashlee Farmer is a 52 y.o. female here today for follow-up of a left ankle fracture.  She reports that on 12/29/2022 she slipped going down the stairs at home, subsequently falling down multiple stairs.  She had immediate severe pain in the ankle with inability to weight-bear.  She was seen in the emergency department shortly after and x-rays revealed a bimalleolar fracture.  She was placed in a short leg splint and has been taking Advil and Tylenol for pain.  Overall she states she is managing okay with this.  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  This does present challenges now with her injury as far as transportation and ensuring there is care for her mother.   Surgical History:   None  PMH/PSH/Family History/Social History/Meds/Allergies:    Past Medical History:  Diagnosis Date   Allergy    Anxiety    Depression    Endometriosis of bladder    Heart murmur    IBS (irritable bowel syndrome)    Past Surgical History:  Procedure Laterality Date   ABLATION ON ENDOMETRIOSIS     Social History   Socioeconomic History   Marital status: Divorced    Spouse name: Not on file   Number of children: Not on file   Years of education: Not on file   Highest education level: Not on file  Occupational History   Not on file  Tobacco Use   Smoking status: Never   Smokeless tobacco: Never  Vaping Use   Vaping status: Never Used  Substance and Sexual Activity   Alcohol use: Not Currently   Drug use: No   Sexual activity: Not Currently  Other Topics Concern   Not on file  Social History Narrative   Not on file   Social Determinants of Health   Financial Resource Strain: Not on file  Food Insecurity: Not on file  Transportation Needs: Not on file  Physical Activity: Not on file   Stress: Not on file  Social Connections: Not on file   Family History  Problem Relation Age of Onset   Diabetes Mother    Hyperlipidemia Mother    Hypertension Mother    Diabetes Father    Heart disease Father    Hyperlipidemia Father    Hypertension Father    Hyperlipidemia Brother    Hypertension Brother    No Known Allergies Current Outpatient Medications  Medication Sig Dispense Refill   amLODipine (NORVASC) 10 MG tablet Take 1 tablet (10 mg total) by mouth daily. 90 tablet 1   hydrALAZINE (APRESOLINE) 25 MG tablet Take 1 tablet (25 mg total) by mouth every 8 (eight) hours. 270 tablet 1   ibuprofen (ADVIL) 600 MG tablet Take 1 tablet (600 mg total) by mouth every 8 (eight) hours as needed. 30 tablet 0   losartan (COZAAR) 100 MG tablet Take 1 tablet (100 mg total) by mouth daily. 90 tablet 1   pantoprazole (PROTONIX) 40 MG tablet Take 1 tablet (40 mg total) by mouth 2 (two) times daily. 180 tablet 1   simvastatin (ZOCOR) 20 MG tablet Take 1 tablet (20 mg total) by  mouth once daily at 6 PM. 90 tablet 1   No current facility-administered medications for this visit.   No results found.  Review of Systems:   A ROS was performed including pertinent positives and negatives as documented in the HPI.  Physical Exam :   Constitutional: NAD and appears stated age Neurological: Alert and oriented Psych: Appropriate affect and cooperative Last menstrual period 06/17/2015.   Comprehensive Musculoskeletal Exam:    Patient is resting comfortably with left ankle in a short leg splint.  All 5 toes appear warm and well-perfused.  Neurosensory exam to the foot is intact although she does report overall slight decrease in sensation through the foot.  Imaging:   Xray review from ED on 12/29/2022 (left ankle 3 view): Mildly displaced bimalleolar fracture   I personally reviewed and interpreted the radiographs.   Assessment:   52 y.o. female with a left bimalleolar fracture that  occurred 5 days ago.  She does remain in a short leg splint and is taking Advil and Tylenol for pain control.  She is not getting around well as she is unable to tolerate crutches and tries to use a walker while at home.  I discussed with her the unstable nature of these fractures and that surgical fixation may be necessary.  I will have her follow-up with Dr. Roda Shutters who was on-call at the time of her injury.  He will plan to see her tomorrow for further assessment and management.  Patient has voiced concern about her transportation issues and not being able to help at home as her mother's primary caregiver.  Plan :    -Follow-up tomorrow with Dr. Roda Shutters for treatment discussion     I personally saw and evaluated the patient, and participated in the management and treatment plan.  Hazle Nordmann, PA-C Orthopedics

## 2023-01-03 NOTE — Telephone Encounter (Signed)
I called patient to get information and no one answer.  I left a message to return my call.

## 2023-01-03 NOTE — Telephone Encounter (Signed)
She was originally already scheduled for tomorrow morning and apparently canceled it for an appointment with Jean Rosenthal today. I have called and patient will "try" to come tomorrow at 9:30am.

## 2023-01-04 ENCOUNTER — Ambulatory Visit (INDEPENDENT_AMBULATORY_CARE_PROVIDER_SITE_OTHER): Payer: Self-pay | Admitting: Orthopaedic Surgery

## 2023-01-04 ENCOUNTER — Encounter: Payer: Self-pay | Admitting: Orthopaedic Surgery

## 2023-01-04 ENCOUNTER — Ambulatory Visit: Payer: Self-pay | Admitting: Orthopaedic Surgery

## 2023-01-04 DIAGNOSIS — S82842A Displaced bimalleolar fracture of left lower leg, initial encounter for closed fracture: Secondary | ICD-10-CM

## 2023-01-04 NOTE — Progress Notes (Signed)
Office Visit Note   Patient: Ashlee Farmer           Date of Birth: November 12, 1970           MRN: [id number] Visit Date: 01/04/2023              Requested by: Georganna Skeans, MD [id number] [place],  [place] [address] PCP: Georganna Skeans, MD   Assessment & Plan: Visit Diagnoses:  1. Bimalleolar fracture, left, closed, initial encounter     Plan: Ashlee Farmer is a 52 year old female with displaced left bimalleolar ankle fracture.  X-rays reviewed with the patient and treatment recommendation for surgical repair discussed.  We placed her back into a splint and will plan for surgery on Monday.  She will need to be admitted afterwards for PT home safety evaluation and may need placement in a SNF.  Total face to face encounter time was greater than 25 minutes and over half of this time was spent in counseling and/or coordination of care.  Follow-Up Instructions: No follow-ups on file.   Orders:  No orders of the defined types were placed in this encounter.  No orders of the defined types were placed in this encounter.     Procedures: No procedures performed   Clinical Data: No additional findings.   Subjective: Chief Complaint  Patient presents with   Left Ankle - Follow-up    DOI 12/29/2022    HPI [name] is a 52 year old female follow-up from the ER from 12/29/2022 for left ankle injury status post ground-level fall at home.  Sisters present today.  Review of Systems  Constitutional: Negative.   HENT: Negative.    Eyes: Negative.   Respiratory: Negative.    Cardiovascular: Negative.   Endocrine: Negative.   Musculoskeletal: Negative.   Neurological: Negative.   Hematological: Negative.   Psychiatric/Behavioral: Negative.    All other systems reviewed and are negative.    Objective: Vital Signs: LMP 06/17/2015   Physical Exam Vitals and nursing note reviewed.  Constitutional:      Appearance: She is well-developed.  HENT:     Head: Atraumatic.      Nose: Nose normal.  Eyes:     Extraocular Movements: Extraocular movements intact.  Cardiovascular:     Pulses: Normal pulses.  Pulmonary:     Effort: Pulmonary effort is normal.  Abdominal:     Palpations: Abdomen is soft.  Musculoskeletal:     Cervical back: Neck supple.  Skin:    General: Skin is warm.     Capillary Refill: Capillary refill takes less than 2 seconds.  Neurological:     Mental Status: She is alert. Mental status is at baseline.  Psychiatric:        Behavior: Behavior normal.        Thought Content: Thought content normal.        Judgment: Judgment normal.     Ortho Exam Exam of the left ankle shows mild swelling and bruising without any neurovascular compromise. Specialty Comments:  No specialty comments available.  Imaging: No results found.   PMFS History: Patient Active Problem List   Diagnosis Date Noted   Pain, dental 05/30/2021   Hypertensive crisis 08/29/2020   Class 3 severe obesity due to excess calories with serious comorbidity and body mass index (BMI) of 50.0 to 59.9 in adult [facility]) 08/03/2014   Essential hypertension 08/03/2014   Past Medical History:  Diagnosis Date   Allergy    Anxiety    Depression  Endometriosis of bladder    Heart murmur    IBS (irritable bowel syndrome)     Family History  Problem Relation Age of Onset   Diabetes Mother    Hyperlipidemia Mother    Hypertension Mother    Diabetes Father    Heart disease Father    Hyperlipidemia Father    Hypertension Father    Hyperlipidemia Brother    Hypertension Brother     Past Surgical History:  Procedure Laterality Date   ABLATION ON ENDOMETRIOSIS     Social History   Occupational History   Not on file  Tobacco Use   Smoking status: Never   Smokeless tobacco: Never  Vaping Use   Vaping status: Never Used  Substance and Sexual Activity   Alcohol use: Not Currently   Drug use: No   Sexual activity: Not Currently

## 2023-01-04 NOTE — Telephone Encounter (Signed)
I called and spoke with patient about getting more information about her ankle and she stated "she figured it out".  I made her aware that she could call us back if she needed to.

## 2023-01-05 ENCOUNTER — Encounter (HOSPITAL_COMMUNITY): Payer: Self-pay | Admitting: Orthopaedic Surgery

## 2023-01-05 ENCOUNTER — Other Ambulatory Visit: Payer: Self-pay

## 2023-01-05 NOTE — Progress Notes (Signed)
SDW CALL  Patient was given pre-op instructions over the phone. The opportunity was given for the patient to ask questions. No further questions asked. Patient verbalized understanding of instructions given.   PCP - Ival Bible Cardiologist - denies  PPM/ICD - denies Device Orders -  Rep Notified -   Chest x-ray - na EKG - DOS Stress Test - denies ECHO - 08/31/20 Cardiac Cath - denies  Sleep Study - denies CPAP -   Fasting Blood Sugar - na Checks Blood Sugar _____ times a day  Blood Thinner Instructions:na Aspirin Instructions:pt stated she will call Dr. Warren Danes office today for instructions regarding when to hold Aspirin. Gave patient Dr. Warren Danes office phone number.   ERAS Protcol - clears until 0920 PRE-SURGERY Ensure or G2-   COVID TEST- na   Anesthesia review: no  Patient denies shortness of breath, fever, cough and chest pain over the phone call   Surgical Instructions    Your procedure is scheduled on Monday December 16  Report to [facility] Main Entrance "A" at 0950 A.M., then check in with the Admitting office.  Call this number if you have problems the morning of surgery:  [phone]    Remember:  Do not eat after midnight the night before your surgery  You may drink clear liquids until 0920am the morning of your surgery.   Clear liquids allowed are: Water, Non-Citrus Juices (without pulp), Carbonated Beverages, Clear Tea, Black Coffee ONLY (NO MILK, CREAM OR POWDERED CREAMER of any kind), and Gatorade   Take these medicines the morning of surgery with A SIP OF WATER: Norvasc,Apresoline,Protonix.  Tylenol If needed.  As of today, STOP taking any Aspirin (unless otherwise instructed by your surgeon) Aleve, Naproxen, Ibuprofen, Motrin, Advil, Goody's, BC's, all herbal medications, fish oil, and all vitamins.  [facility] is not responsible for any belongings or valuables. .   Do NOT Smoke (Tobacco/Vaping)  24 hours prior to your procedure  If you  use a CPAP at night, you may bring your mask for your overnight stay.   Contacts, glasses, hearing aids, dentures or partials may not be worn into surgery, please bring cases for these belongings   Patients discharged the day of surgery will not be allowed to drive home, and someone needs to stay with them for 24 hours.    Special instructions:    Oral Hygiene is also important to reduce your risk of infection.  Remember - BRUSH YOUR TEETH THE MORNING OF SURGERY WITH YOUR REGULAR TOOTHPASTE   Day of Surgery:  Take a shower the day of or night before with antibacterial soap. Wear Clean/Comfortable clothing the morning of surgery Do not apply any deodorants/lotions.   Do not wear jewelry or makeup Do not wear lotions, powders, perfumes/colognes, or deodorant. Do not shave 48 hours prior to surgery.  Men may shave face and neck. Do not bring valuables to the hospital. Do not wear nail polish, gel polish, artificial nails, or any other type of covering on natural nails (fingers and toes) If you have artificial nails or gel coating that need to be removed by a nail salon, please have this removed prior to surgery. Artificial nails or gel coating may interfere with anesthesia's ability to adequately monitor your vital signs. Remember to brush your teeth WITH YOUR REGULAR TOOTHPASTE.

## 2023-01-07 NOTE — Anesthesia Preprocedure Evaluation (Signed)
Anesthesia Evaluation  Patient identified by MRN, date of birth, ID band Patient awake    Reviewed: Allergy & Precautions, NPO status , Patient's Chart, lab work & pertinent test results  Airway Mallampati: I  TM Distance: >3 FB Neck ROM: Full    Dental  (+) Loose, Chipped, Dental Advisory Given,    Pulmonary PE   Pulmonary exam normal breath sounds clear to auscultation       Cardiovascular hypertension, Pt. on medications Normal cardiovascular exam Rhythm:Regular Rate:Normal  TTE 2022 1. Left ventricular ejection fraction, by estimation, is 60 to 65%. The  left ventricle has normal function. The left ventricle has no regional  wall motion abnormalities. There is mild left ventricular hypertrophy.  Left ventricular diastolic parameters  are consistent with Grade I diastolic dysfunction (impaired relaxation).   2. Right ventricular systolic function is normal. The right ventricular  size is normal.   3. The mitral valve is normal in structure. No evidence of mitral valve  regurgitation. No evidence of mitral stenosis.   4. The aortic valve is calcified. Aortic valve regurgitation is mild.  Mild aortic valve sclerosis is present, with no evidence of aortic valve  stenosis.   5. The inferior vena cava is normal in size with greater than 50%  respiratory variability, suggesting right atrial pressure of 3 mmHg.     Neuro/Psych  PSYCHIATRIC DISORDERS Anxiety Depression    negative neurological ROS     GI/Hepatic Neg liver ROS,GERD  Medicated,,  Endo/Other    Class 4 obesity (BMI 52)  Renal/GU negative Renal ROS  negative genitourinary   Musculoskeletal negative musculoskeletal ROS (+)    Abdominal   Peds  Hematology negative hematology ROS (+)   Anesthesia Other Findings   Reproductive/Obstetrics                             Anesthesia Physical Anesthesia Plan  ASA: 3  Anesthesia Plan:  General and Regional   Post-op Pain Management: Tylenol PO (pre-op)* and Regional block*   Induction: Intravenous  PONV Risk Score and Plan: 3 and Ondansetron, Dexamethasone and Midazolam  Airway Management Planned: LMA and Oral ETT  Additional Equipment:   Intra-op Plan:   Post-operative Plan: Extubation in OR  Informed Consent: I have reviewed the patients History and Physical, chart, labs and discussed the procedure including the risks, benefits and alternatives for the proposed anesthesia with the patient or authorized representative who has indicated his/her understanding and acceptance.     Dental advisory given  Plan Discussed with: CRNA  Anesthesia Plan Comments:        Anesthesia Quick Evaluation

## 2023-01-08 ENCOUNTER — Other Ambulatory Visit: Payer: Self-pay

## 2023-01-08 ENCOUNTER — Ambulatory Visit (HOSPITAL_COMMUNITY): Payer: Medicaid Other | Admitting: Anesthesiology

## 2023-01-08 ENCOUNTER — Observation Stay (HOSPITAL_COMMUNITY)
Admission: RE | Admit: 2023-01-08 | Discharge: 2023-01-10 | Disposition: A | Discharge status: Home / Self Care | Payer: Medicaid Other | Attending: Orthopaedic Surgery | Admitting: Orthopaedic Surgery

## 2023-01-08 ENCOUNTER — Ambulatory Visit (HOSPITAL_COMMUNITY): Payer: Medicaid Other

## 2023-01-08 ENCOUNTER — Encounter (HOSPITAL_COMMUNITY): Payer: Self-pay | Admitting: Orthopaedic Surgery

## 2023-01-08 ENCOUNTER — Encounter (HOSPITAL_COMMUNITY)
Admission: RE | Disposition: A | Discharge status: Skilled Nursing Facility | Payer: Self-pay | Source: Home / Self Care | Attending: Orthopaedic Surgery

## 2023-01-08 ENCOUNTER — Other Ambulatory Visit: Payer: Self-pay | Admitting: Physician Assistant

## 2023-01-08 DIAGNOSIS — G8918 Other acute postprocedural pain: Secondary | ICD-10-CM | POA: Diagnosis not present

## 2023-01-08 DIAGNOSIS — S8252XA Displaced fracture of medial malleolus of left tibia, initial encounter for closed fracture: Secondary | ICD-10-CM | POA: Diagnosis not present

## 2023-01-08 DIAGNOSIS — Z9889 Other specified postprocedural states: Secondary | ICD-10-CM

## 2023-01-08 DIAGNOSIS — I10 Essential (primary) hypertension: Secondary | ICD-10-CM | POA: Diagnosis not present

## 2023-01-08 DIAGNOSIS — S82842A Displaced bimalleolar fracture of left lower leg, initial encounter for closed fracture: Secondary | ICD-10-CM

## 2023-01-08 DIAGNOSIS — Z7982 Long term (current) use of aspirin: Secondary | ICD-10-CM | POA: Insufficient documentation

## 2023-01-08 DIAGNOSIS — Z7901 Long term (current) use of anticoagulants: Secondary | ICD-10-CM | POA: Insufficient documentation

## 2023-01-08 DIAGNOSIS — X58XXXA Exposure to other specified factors, initial encounter: Secondary | ICD-10-CM | POA: Insufficient documentation

## 2023-01-08 DIAGNOSIS — S82852A Displaced trimalleolar fracture of left lower leg, initial encounter for closed fracture: Principal | ICD-10-CM | POA: Insufficient documentation

## 2023-01-08 DIAGNOSIS — S82402A Unspecified fracture of shaft of left fibula, initial encounter for closed fracture: Secondary | ICD-10-CM | POA: Diagnosis not present

## 2023-01-08 DIAGNOSIS — Z79899 Other long term (current) drug therapy: Secondary | ICD-10-CM | POA: Insufficient documentation

## 2023-01-08 DIAGNOSIS — F418 Other specified anxiety disorders: Secondary | ICD-10-CM | POA: Diagnosis not present

## 2023-01-08 HISTORY — DX: Gastro-esophageal reflux disease without esophagitis: K21.9

## 2023-01-08 HISTORY — DX: Family history of other specified conditions: Z84.89

## 2023-01-08 HISTORY — PX: ORIF ANKLE FRACTURE: SHX5408

## 2023-01-08 HISTORY — DX: Essential (primary) hypertension: I10

## 2023-01-08 LAB — CBC
HCT: 39.9 % (ref 36.0–46.0)
Hemoglobin: 12.6 g/dL (ref 12.0–15.0)
MCH: 28.8 pg (ref 26.0–34.0)
MCHC: 31.6 g/dL (ref 30.0–36.0)
MCV: 91.1 fL (ref 80.0–100.0)
Platelets: 442 10*3/uL — ABNORMAL HIGH (ref 150–400)
RBC: 4.38 MIL/uL (ref 3.87–5.11)
RDW: 14.3 % (ref 11.5–15.5)
WBC: 10.5 10*3/uL (ref 4.0–10.5)
nRBC: 0 % (ref 0.0–0.2)

## 2023-01-08 LAB — BASIC METABOLIC PANEL
Anion gap: 14 (ref 5–15)
BUN: 23 mg/dL — ABNORMAL HIGH (ref 6–20)
CO2: 21 mmol/L — ABNORMAL LOW (ref 22–32)
Calcium: 9.7 mg/dL (ref 8.9–10.3)
Chloride: 100 mmol/L (ref 98–111)
Creatinine, Ser: 0.91 mg/dL (ref 0.44–1.00)
GFR, Estimated: >60 mL/min (ref >60)
Glucose, Bld: 101 mg/dL — ABNORMAL HIGH (ref 70–99)
Potassium: 3.7 mmol/L (ref 3.5–5.1)
Sodium: 135 mmol/L (ref 135–145)

## 2023-01-08 LAB — SURGICAL PCR SCREEN
MRSA, PCR: NEGATIVE
Staphylococcus aureus: POSITIVE — AB

## 2023-01-08 SURGERY — OPEN REDUCTION INTERNAL FIXATION (ORIF) ANKLE FRACTURE
Anesthesia: Regional | Site: Ankle | Laterality: Left

## 2023-01-08 MED ORDER — FENTANYL CITRATE (PF) 250 MCG/5ML IJ SOLN
INTRAMUSCULAR | Status: AC
  Filled 2023-01-08: qty 5

## 2023-01-08 MED ORDER — MIDAZOLAM HCL 2 MG/2ML IJ SOLN
INTRAMUSCULAR | Status: AC
  Administered 2023-01-08: 2 mg via INTRAVENOUS
  Filled 2023-01-08: qty 2

## 2023-01-08 MED ORDER — ACETAMINOPHEN 325 MG PO TABS
325.0000 mg | ORAL_TABLET | Freq: Four times a day (QID) | ORAL | Status: DC | PRN
Start: 2023-01-09 — End: 2023-01-10

## 2023-01-08 MED ORDER — ACETAMINOPHEN 500 MG PO TABS: 1000.0000 mg | ORAL_TABLET | Freq: Once | ORAL | Status: DC

## 2023-01-08 MED ORDER — CEFAZOLIN SODIUM-DEXTROSE 2-4 GM/100ML-% IV SOLN
2.0000 g | Freq: Four times a day (QID) | INTRAVENOUS | Status: AC
  Administered 2023-01-08 – 2023-01-09 (×3): 2 g via INTRAVENOUS
  Filled 2023-01-08 (×3): qty 100

## 2023-01-08 MED ORDER — CHLORHEXIDINE GLUCONATE 0.12 % MT SOLN
OROMUCOSAL | Status: AC
  Administered 2023-01-08: 15 mL via OROMUCOSAL
  Filled 2023-01-08: qty 15

## 2023-01-08 MED ORDER — SODIUM CHLORIDE 0.9 % IV SOLN
3.0000 g | INTRAVENOUS | Status: DC
  Administered 2023-01-08: 3 g via INTRAVENOUS

## 2023-01-08 MED ORDER — RIVAROXABAN 10 MG PO TABS: 10.0000 mg | ORAL_TABLET | Freq: Every day | ORAL | 0 refills | Status: DC

## 2023-01-08 MED ORDER — RIVAROXABAN 10 MG PO TABS
10.0000 mg | ORAL_TABLET | Freq: Every day | ORAL | Status: DC
  Administered 2023-01-09 – 2023-01-10 (×2): 10 mg via ORAL
  Filled 2023-01-08 (×2): qty 1

## 2023-01-08 MED ORDER — TRANEXAMIC ACID-NACL 1000-0.7 MG/100ML-% IV SOLN
1000.0000 mg | Freq: Once | INTRAVENOUS | Status: AC
  Administered 2023-01-08: 1000 mg via INTRAVENOUS
  Filled 2023-01-08: qty 100

## 2023-01-08 MED ORDER — FENTANYL CITRATE (PF) 100 MCG/2ML IJ SOLN
INTRAMUSCULAR | Status: AC
  Administered 2023-01-08: 100 ug via INTRAVENOUS
  Filled 2023-01-08: qty 2

## 2023-01-08 MED ORDER — EPHEDRINE SULFATE-NACL 50-0.9 MG/10ML-% IV SOSY
PREFILLED_SYRINGE | INTRAVENOUS | Status: DC | PRN
  Administered 2023-01-08: 10 mg via INTRAVENOUS

## 2023-01-08 MED ORDER — 0.9 % SODIUM CHLORIDE (POUR BTL) OPTIME
TOPICAL | Status: DC | PRN
  Administered 2023-01-08: 1000 mL

## 2023-01-08 MED ORDER — SODIUM CHLORIDE 0.9 % IV SOLN
INTRAVENOUS | Status: DC
Start: 2023-01-08 — End: 2023-01-08

## 2023-01-08 MED ORDER — AMLODIPINE BESYLATE 10 MG PO TABS
10.0000 mg | ORAL_TABLET | Freq: Every day | ORAL | Status: DC
  Administered 2023-01-09 – 2023-01-10 (×2): 10 mg via ORAL
  Filled 2023-01-08 (×2): qty 2

## 2023-01-08 MED ORDER — POLYETHYLENE GLYCOL 3350 17 G PO PACK: 17.0000 g | PACK | Freq: Every day | ORAL | Status: DC | PRN

## 2023-01-08 MED ORDER — ONDANSETRON HCL 4 MG PO TABS: 4.0000 mg | ORAL_TABLET | Freq: Three times a day (TID) | ORAL | 0 refills | Status: AC | PRN

## 2023-01-08 MED ORDER — FENTANYL CITRATE (PF) 100 MCG/2ML IJ SOLN
INTRAMUSCULAR | Status: AC
  Filled 2023-01-08: qty 2

## 2023-01-08 MED ORDER — CHLORHEXIDINE GLUCONATE 0.12 % MT SOLN: 15.0000 mL | Freq: Once | OROMUCOSAL | Status: AC

## 2023-01-08 MED ORDER — LIDOCAINE 2% (20 MG/ML) 5 ML SYRINGE
INTRAMUSCULAR | Status: DC | PRN
  Administered 2023-01-08: 20 mg via INTRAVENOUS

## 2023-01-08 MED ORDER — PHENYLEPHRINE 80 MCG/ML (10ML) SYRINGE FOR IV PUSH (FOR BLOOD PRESSURE SUPPORT)
PREFILLED_SYRINGE | INTRAVENOUS | Status: DC | PRN
  Administered 2023-01-08: 80 ug via INTRAVENOUS
  Administered 2023-01-08 (×4): 160 ug via INTRAVENOUS

## 2023-01-08 MED ORDER — OXYCODONE HCL 5 MG PO TABS
10.0000 mg | ORAL_TABLET | ORAL | Status: DC | PRN
  Administered 2023-01-08 – 2023-01-09 (×3): 15 mg via ORAL
  Filled 2023-01-08 (×3): qty 3

## 2023-01-08 MED ORDER — METOCLOPRAMIDE HCL 5 MG/ML IJ SOLN: 5.0000 mg | Freq: Three times a day (TID) | INTRAMUSCULAR | Status: DC | PRN

## 2023-01-08 MED ORDER — MIDAZOLAM HCL 2 MG/2ML IJ SOLN
INTRAMUSCULAR | Status: AC
  Filled 2023-01-08: qty 2

## 2023-01-08 MED ORDER — ONDANSETRON HCL 4 MG/2ML IJ SOLN: 4.0000 mg | Freq: Four times a day (QID) | INTRAMUSCULAR | Status: DC | PRN

## 2023-01-08 MED ORDER — HYDROCODONE-ACETAMINOPHEN 5-325 MG PO TABS: 1.0000 | ORAL_TABLET | Freq: Three times a day (TID) | ORAL | 0 refills | Status: DC | PRN

## 2023-01-08 MED ORDER — PROPOFOL 10 MG/ML IV BOLUS
INTRAVENOUS | Status: DC | PRN
  Administered 2023-01-08: 200 mg via INTRAVENOUS
  Administered 2023-01-08: 100 mg via INTRAVENOUS

## 2023-01-08 MED ORDER — DEXAMETHASONE SODIUM PHOSPHATE 10 MG/ML IJ SOLN
INTRAMUSCULAR | Status: DC | PRN
  Administered 2023-01-08 (×2): 5 mg

## 2023-01-08 MED ORDER — ACETAMINOPHEN 500 MG PO TABS
1000.0000 mg | ORAL_TABLET | Freq: Four times a day (QID) | ORAL | Status: AC
  Administered 2023-01-08 – 2023-01-09 (×4): 1000 mg via ORAL
  Filled 2023-01-08 (×4): qty 2

## 2023-01-08 MED ORDER — HYDRALAZINE HCL 25 MG PO TABS
25.0000 mg | ORAL_TABLET | Freq: Every day | ORAL | Status: DC
  Administered 2023-01-09 – 2023-01-10 (×2): 25 mg via ORAL
  Filled 2023-01-08 (×2): qty 1

## 2023-01-08 MED ORDER — DEXAMETHASONE SODIUM PHOSPHATE 10 MG/ML IJ SOLN
INTRAMUSCULAR | Status: AC
  Filled 2023-01-08: qty 1

## 2023-01-08 MED ORDER — ORAL CARE MOUTH RINSE: 15.0000 mL | Freq: Once | OROMUCOSAL | Status: AC

## 2023-01-08 MED ORDER — MIDAZOLAM HCL 2 MG/2ML IJ SOLN: 2.0000 mg | Freq: Once | INTRAMUSCULAR | Status: AC

## 2023-01-08 MED ORDER — MAGNESIUM CITRATE PO SOLN
1.0000 | Freq: Once | ORAL | Status: DC | PRN
  Filled 2023-01-08: qty 296

## 2023-01-08 MED ORDER — LOSARTAN POTASSIUM 50 MG PO TABS
100.0000 mg | ORAL_TABLET | Freq: Every day | ORAL | Status: DC
  Administered 2023-01-09 – 2023-01-10 (×2): 100 mg via ORAL
  Filled 2023-01-08 (×2): qty 2

## 2023-01-08 MED ORDER — AMISULPRIDE (ANTIEMETIC) 5 MG/2ML IV SOLN
10.0000 mg | Freq: Once | INTRAVENOUS | Status: AC
  Administered 2023-01-08: 10 mg via INTRAVENOUS

## 2023-01-08 MED ORDER — METHOCARBAMOL 500 MG PO TABS
500.0000 mg | ORAL_TABLET | Freq: Four times a day (QID) | ORAL | Status: DC | PRN
  Administered 2023-01-09 (×2): 500 mg via ORAL
  Filled 2023-01-08 (×2): qty 1

## 2023-01-08 MED ORDER — FENTANYL CITRATE (PF) 250 MCG/5ML IJ SOLN
INTRAMUSCULAR | Status: DC | PRN
  Administered 2023-01-08: 50 ug via INTRAVENOUS

## 2023-01-08 MED ORDER — METHOCARBAMOL 1000 MG/10ML IJ SOLN: 500.0000 mg | Freq: Four times a day (QID) | INTRAMUSCULAR | Status: DC | PRN

## 2023-01-08 MED ORDER — ACETAMINOPHEN 500 MG PO TABS
ORAL_TABLET | ORAL | Status: AC
  Filled 2023-01-08: qty 2

## 2023-01-08 MED ORDER — ONDANSETRON HCL 4 MG/2ML IJ SOLN
INTRAMUSCULAR | Status: AC
  Filled 2023-01-08: qty 2

## 2023-01-08 MED ORDER — FENTANYL CITRATE (PF) 100 MCG/2ML IJ SOLN: 100.0000 ug | Freq: Once | INTRAMUSCULAR | Status: AC

## 2023-01-08 MED ORDER — FENTANYL CITRATE (PF) 100 MCG/2ML IJ SOLN
25.0000 ug | INTRAMUSCULAR | Status: DC | PRN
  Administered 2023-01-08 (×2): 50 ug via INTRAVENOUS

## 2023-01-08 MED ORDER — SORBITOL 70 % SOLN: 30.0000 mL | Freq: Every day | Status: DC | PRN

## 2023-01-08 MED ORDER — AMISULPRIDE (ANTIEMETIC) 5 MG/2ML IV SOLN
INTRAVENOUS | Status: AC
  Filled 2023-01-08: qty 4

## 2023-01-08 MED ORDER — ROPIVACAINE HCL 5 MG/ML IJ SOLN
INTRAMUSCULAR | Status: DC | PRN
  Administered 2023-01-08: 25 mL via PERINEURAL
  Administered 2023-01-08: 30 mL via PERINEURAL

## 2023-01-08 MED ORDER — HYDROMORPHONE HCL 1 MG/ML IJ SOLN: 0.5000 mg | INTRAMUSCULAR | Status: DC | PRN

## 2023-01-08 MED ORDER — DOCUSATE SODIUM 100 MG PO CAPS
100.0000 mg | ORAL_CAPSULE | Freq: Two times a day (BID) | ORAL | Status: DC
  Filled 2023-01-08 (×2): qty 1

## 2023-01-08 MED ORDER — DIPHENHYDRAMINE HCL 12.5 MG/5ML PO ELIX: 25.0000 mg | ORAL_SOLUTION | ORAL | Status: DC | PRN

## 2023-01-08 MED ORDER — SODIUM CHLORIDE 0.9 % IV SOLN
INTRAVENOUS | Status: AC
  Filled 2023-01-08: qty 3

## 2023-01-08 MED ORDER — OXYCODONE HCL 5 MG PO TABS
5.0000 mg | ORAL_TABLET | ORAL | Status: DC | PRN
  Administered 2023-01-09 – 2023-01-10 (×2): 10 mg via ORAL
  Filled 2023-01-08 (×2): qty 2

## 2023-01-08 MED ORDER — METOCLOPRAMIDE HCL 5 MG PO TABS: 5.0000 mg | ORAL_TABLET | Freq: Three times a day (TID) | ORAL | Status: DC | PRN

## 2023-01-08 MED ORDER — ONDANSETRON HCL 4 MG PO TABS: 4.0000 mg | ORAL_TABLET | Freq: Four times a day (QID) | ORAL | Status: DC | PRN

## 2023-01-08 MED ORDER — BUPIVACAINE HCL (PF) 0.25 % IJ SOLN
INTRAMUSCULAR | Status: AC
  Filled 2023-01-08: qty 30

## 2023-01-08 SURGICAL SUPPLY — 68 items
BAG COUNTER SPONGE SURGICOUNT (BAG) ×1 IMPLANT
BANDAGE ESMARK 6X9 LF (GAUZE/BANDAGES/DRESSINGS) IMPLANT
BIT DRILL 2.4 AO COUPLING CANN (BIT) IMPLANT
BIT DRILL SHORT 2.5 (BIT) IMPLANT
BIT DRL SHORT 2.5 (BIT) ×1
BLADE SURG 15 STRL LF DISP TIS (BLADE) ×1 IMPLANT
BNDG COHESIVE 4X5 TAN STRL (GAUZE/BANDAGES/DRESSINGS) ×1 IMPLANT
BNDG COHESIVE 4X5 WHT NS (GAUZE/BANDAGES/DRESSINGS) IMPLANT
BNDG ELASTIC 4X5.8 VLCR STR LF (GAUZE/BANDAGES/DRESSINGS) IMPLANT
BNDG ELASTIC 6X5.8 VLCR STR LF (GAUZE/BANDAGES/DRESSINGS) IMPLANT
BNDG ESMARK 6X9 LF (GAUZE/BANDAGES/DRESSINGS)
CANISTER SUCT 3000ML PPV (MISCELLANEOUS) ×1 IMPLANT
COVER SURGICAL LIGHT HANDLE (MISCELLANEOUS) ×1 IMPLANT
CUFF TOURN SGL QUICK 42 (TOURNIQUET CUFF) IMPLANT
CUFF TRNQT CYL 34X4.125X (TOURNIQUET CUFF) IMPLANT
DRAPE C-ARM 42X72 X-RAY (DRAPES) ×1 IMPLANT
DRAPE C-ARMOR (DRAPES) ×1 IMPLANT
DRAPE INCISE IOBAN 66X45 STRL (DRAPES) IMPLANT
DRAPE U-SHAPE 47X51 STRL (DRAPES) ×1 IMPLANT
DRIVER CANN HEX BT15 (ORTHOPEDIC DISPOSABLE SUPPLIES) IMPLANT
DRIVER RETENTION T15 LONG (ORTHOPEDIC DISPOSABLE SUPPLIES) IMPLANT
DURAPREP 26ML APPLICATOR (WOUND CARE) ×1 IMPLANT
ELECT CAUTERY BLADE 6.4 (BLADE) ×1 IMPLANT
ELECT REM PT RETURN 9FT ADLT (ELECTROSURGICAL) ×1
ELECTRODE REM PT RTRN 9FT ADLT (ELECTROSURGICAL) ×1 IMPLANT
GAUZE PAD ABD 8X10 STRL (GAUZE/BANDAGES/DRESSINGS) IMPLANT
GAUZE SPONGE 4X4 12PLY STRL (GAUZE/BANDAGES/DRESSINGS) ×1 IMPLANT
GAUZE XEROFORM 5X9 LF (GAUZE/BANDAGES/DRESSINGS) ×1 IMPLANT
GLOVE BIOGEL PI IND STRL 7.0 (GLOVE) ×2 IMPLANT
GLOVE BIOGEL PI IND STRL 7.5 (GLOVE) ×3 IMPLANT
GLOVE ECLIPSE 7.0 STRL STRAW (GLOVE) ×1 IMPLANT
GLOVE SKINSENSE STRL SZ7.5 (GLOVE) ×1 IMPLANT
GLOVE SURG SYN 7.5 E (GLOVE) ×2
GLOVE SURG SYN 7.5 PF PI (GLOVE) ×2 IMPLANT
GLOVE SURG UNDER POLY LF SZ7 (GLOVE) ×19 IMPLANT
GLOVE SURG UNDER POLY LF SZ7.5 (GLOVE) ×2 IMPLANT
GOWN STRL SURGICAL XL XLNG (GOWN DISPOSABLE) ×1 IMPLANT
K-WIRE ALPS MXV 1.6X6 ZI (WIRE) ×2
K-WIRE TROC 1.25X150 (WIRE) ×1
KIT BASIN OR (CUSTOM PROCEDURE TRAY) ×1 IMPLANT
KIT TURNOVER KIT B (KITS) ×1 IMPLANT
KWIRE ALPS MXV 1.6X6 ZI (WIRE) IMPLANT
KWIRE TROC 1.25X150 (WIRE) IMPLANT
NDL HYPO 25GX1X1/2 BEV (NEEDLE) IMPLANT
NEEDLE HYPO 25GX1X1/2 BEV (NEEDLE)
NS IRRIG 1000ML POUR BTL (IV SOLUTION) ×1 IMPLANT
PACK ORTHO EXTREMITY (CUSTOM PROCEDURE TRAY) ×1 IMPLANT
PAD ARMBOARD 7.5X6 YLW CONV (MISCELLANEOUS) ×2 IMPLANT
PADDING CAST ABS COTTON 6X4 NS (CAST SUPPLIES) IMPLANT
PADDING CAST COTTON 6X4 STRL (CAST SUPPLIES) ×1 IMPLANT
PLATE TUB 1/3 8H (Plate) IMPLANT
SCREW CANN FT 4.0X38MM (Screw) IMPLANT
SCREW LOCK MDS 3.5X12 (Screw) IMPLANT
SCREW LOCK MDS 3.5X16 (Screw) IMPLANT
SCREW LOCK MDS 3.5X18 (Screw) IMPLANT
SCREW NON-LOCK 3.5X10 (Screw) IMPLANT
SCREW NON-LOCK 3.5X12 (Screw) IMPLANT
SLEEVE SCD COMPRESS KNEE XLRG (STOCKING) IMPLANT
SUCTION TUBE FRAZIER 10FR DISP (SUCTIONS) ×1 IMPLANT
SUT ETHILON 3 0 PS 1 (SUTURE) IMPLANT
SUT VIC AB 0 CT1 27XBRD ANBCTR (SUTURE) IMPLANT
SUT VIC AB 2-0 CT1 TAPERPNT 27 (SUTURE) IMPLANT
SUT VIC AB 3-0 PS2 18XBRD (SUTURE) IMPLANT
SYR CONTROL 10ML LL (SYRINGE) IMPLANT
TOWEL GREEN STERILE (TOWEL DISPOSABLE) ×2 IMPLANT
TOWEL GREEN STERILE FF (TOWEL DISPOSABLE) ×1 IMPLANT
TUBE CONNECTING 12X1/4 (SUCTIONS) ×1 IMPLANT
UNDERPAD 30X36 HEAVY ABSORB (UNDERPADS AND DIAPERS) ×1 IMPLANT

## 2023-01-08 NOTE — Discharge Instructions (Signed)
    1. Keep splint clean and dry 2. Elevate foot above level of the heart 3. Take xarelto to prevent blood clots 4. Take pain meds as needed 5. Strict non weight bearing to operative extremity  Per [facility] clinic policy, our goal is ensure optimal postoperative pain control with a multimodal pain management strategy. For all [facility] patients, our goal is to wean post-operative narcotic medications by 6 weeks post-operatively. If this is not possible due to utilization of pain medication prior to surgery, your [facility] doctor will support your acute post-operative pain control for the first 6 weeks postoperatively, with a plan to transition you back to your primary pain team following that. Cyndia Skeeters will work to ensure a Therapist, occupational.

## 2023-01-08 NOTE — H&P (Signed)
PREOPERATIVE H&P  Chief Complaint: left bimalleolar ankle fracture  HPI: Ashlee Farmer is a 52 y.o. female who presents for surgical treatment of left bimalleolar ankle fracture.  She denies any changes in medical history.  Past Surgical History:  Procedure Laterality Date   ABLATION ON ENDOMETRIOSIS     Social History   Socioeconomic History   Marital status: Divorced    Spouse name: Not on file   Number of children: Not on file   Years of education: Not on file   Highest education level: Not on file  Occupational History   Not on file  Tobacco Use   Smoking status: Never   Smokeless tobacco: Never  Vaping Use   Vaping status: Never Used  Substance and Sexual Activity   Alcohol use: Not Currently   Drug use: No   Sexual activity: Not Currently  Other Topics Concern   Not on file  Social History Narrative   Not on file   Social Drivers of Health   Financial Resource Strain: Not on file  Food Insecurity: Not on file  Transportation Needs: Not on file  Physical Activity: Not on file  Stress: Not on file  Social Connections: Not on file   Family History  Problem Relation Age of Onset   Diabetes Mother    Hyperlipidemia Mother    Hypertension Mother    Diabetes Father    Heart disease Father    Hyperlipidemia Father    Hypertension Father    Hyperlipidemia Brother    Hypertension Brother    No Known Allergies Prior to Admission medications   Medication Sig Start Date End Date Taking? Authorizing Provider  acetaminophen (TYLENOL) 500 MG tablet Take 1,000 mg by mouth every 6 (six) hours as needed for mild pain (pain score 1-3) or moderate pain (pain score 4-6).   Yes [provider]  amLODipine (NORVASC) 10 MG tablet Take 1 tablet (10 mg total) by mouth daily. 11/23/22  Yes Georganna Skeans, MD  aspirin EC 81 MG tablet Take 81 mg by mouth daily. Swallow whole.   Yes [provider]  Carboxymethylcell-Glycerin PF (REFRESH TEARS PF)  0.5-0.9 % SOLN Place 1 drop into both eyes daily as needed (Dry eye).   Yes [provider]  hydrALAZINE (APRESOLINE) 25 MG tablet Take 1 tablet (25 mg total) by mouth every 8 (eight) hours. Patient taking differently: Take 25 mg by mouth daily. 11/23/22  Yes Georganna Skeans, MD  ibuprofen (ADVIL) 600 MG tablet Take 1 tablet (600 mg total) by mouth every 8 (eight) hours as needed. Patient taking differently: Take 800 mg by mouth every 8 (eight) hours as needed for mild pain (pain score 1-3) or moderate pain (pain score 4-6). 05/30/21  Yes Mayers, Cari S, PA-C  losartan (COZAAR) 100 MG tablet Take 1 tablet (100 mg total) by mouth daily. 11/28/22  Yes Georganna Skeans, MD  pantoprazole (PROTONIX) 40 MG tablet Take 1 tablet (40 mg total) by mouth 2 (two) times daily. Patient taking differently: Take 40 mg by mouth daily. 11/23/22  Yes Georganna Skeans, MD  rivaroxaban (XARELTO) 10 MG TABS tablet Take 1 tablet (10 mg total) by mouth daily. To be taken after surgery to prevent blood clots 01/08/23   Cristie Hem, PA-C  simvastatin (ZOCOR) 20 MG tablet Take 1 tablet (20 mg total) by mouth once daily at 6 PM. 11/23/22  Yes Georganna Skeans, MD  HYDROcodone-acetaminophen [facility]) 5-325 MG tablet Take 1-2 tablets by mouth 3 (  three) times daily as needed. 01/08/23   Cristie Hem, PA-C  ondansetron (ZOFRAN) 4 MG tablet Take 1 tablet (4 mg total) by mouth every 8 (eight) hours as needed for nausea or vomiting. 01/08/23   Cristie Hem, PA-C     Positive ROS: All other systems have been reviewed and were otherwise negative with the exception of those mentioned in the HPI and as above.  Physical Exam: General: Alert, no acute distress Cardiovascular: No pedal edema Respiratory: No cyanosis, no use of accessory musculature GI: abdomen soft Skin: No lesions in the area of chief complaint Neurologic: Sensation intact distally Psychiatric: Patient is competent for consent with normal mood and  affect Lymphatic: no lymphedema  MUSCULOSKELETAL: exam stable  Assessment: left bimalleolar ankle fracture  Plan: Plan for Procedure(s): OPEN REDUCTION INTERNAL FIXATION (ORIF) LEFT BIMALLEOLAR ANKLE FRACTURE  The risks benefits and alternatives were discussed with the patient including but not limited to the risks of nonoperative treatment, versus surgical intervention including infection, bleeding, nerve injury,  blood clots, cardiopulmonary complications, morbidity, mortality, among others, and they were willing to proceed.   Glee Arvin, MD 01/08/2023 9:52 AM

## 2023-01-08 NOTE — Anesthesia Procedure Notes (Signed)
Anesthesia Regional Block: Popliteal block   Pre-Anesthetic Checklist: , timeout performed,  Correct Patient, Correct Site, Correct Laterality,  Correct Procedure, Correct Position, site marked,  Risks and benefits discussed,  Pre-op evaluation,  At surgeon's request and post-op pain management  Laterality: Left  Prep: Maximum Sterile Barrier Precautions used, chloraprep       Needles:  Injection technique: Single-shot  Needle Type: Echogenic Stimulator Needle     Needle Length: 9cm  Needle Gauge: 21     Additional Needles:   Procedures:,,,, ultrasound used (permanent image in chart),,    Narrative:  Start time: 01/08/2023 11:30 AM End time: 01/08/2023 11:37 AM Injection made incrementally with aspirations every 5 mL. Anesthesiologist: Elmer Picker, MD

## 2023-01-08 NOTE — Anesthesia Procedure Notes (Signed)
Anesthesia Regional Block: Adductor canal block   Pre-Anesthetic Checklist: , timeout performed,  Correct Patient, Correct Site, Correct Laterality,  Correct Procedure, Correct Position, site marked,  Risks and benefits discussed,  Pre-op evaluation,  At surgeon's request and post-op pain management  Laterality: Left  Prep: Maximum Sterile Barrier Precautions used, chloraprep       Needles:  Injection technique: Single-shot  Needle Type: Echogenic Stimulator Needle     Needle Length: 9cm  Needle Gauge: 21     Additional Needles:   Procedures:,,,, ultrasound used (permanent image in chart),,    Narrative:  Start time: 01/08/2023 11:37 AM End time: 01/08/2023 11:42 AM Injection made incrementally with aspirations every 5 mL. Anesthesiologist: Elmer Picker, MD

## 2023-01-08 NOTE — Transfer of Care (Signed)
Immediate Anesthesia Transfer of Care Note  Patient: Ashlee Farmer  Procedure(s) Performed: OPEN REDUCTION INTERNAL FIXATION (ORIF) LEFT BIMALLEOLAR ANKLE FRACTURE (Left: Ankle)  Patient Location: PACU  Anesthesia Type:General  Level of Consciousness: alert , oriented, and sedated  Airway & Oxygen Therapy: Patient Spontanous Breathing and Patient connected to face mask oxygen  Post-op Assessment: Report given to RN and Post -op Vital signs reviewed and stable  Post vital signs: Reviewed and stable  Last Vitals:  Vitals Value Taken Time  BP 117/63 01/08/23 1439  Temp    Pulse 94 01/08/23 1443  Resp 22 01/08/23 1443  SpO2 94 % 01/08/23 1443  Vitals shown include unfiled device data.  Last Pain:  Vitals:   01/08/23 1145  TempSrc:   PainSc: 0-No pain         Complications: No notable events documented.

## 2023-01-08 NOTE — Op Note (Signed)
Date of Surgery: 01/08/2023  INDICATIONS: Ashlee Farmer is a 52 y.o.-year-old female who sustained a left ankle fracture; she was indicated for open reduction and internal fixation due to the displaced nature of the articular fracture and came to the operating room today for this procedure. The patient did consent to the procedure after discussion of the risks and benefits.  PREOPERATIVE DIAGNOSIS: left trimalleolar ankle fracture  POSTOPERATIVE DIAGNOSIS: Same.  PROCEDURE: Open treatment of left ankle fracture with internal fixation. Trimalleolar w/o fixation of posterior malleolus CPT 27822.  SURGEON: N. Glee Arvin, M.D.  ASSIST: Ashlee Grout, PA-C  ANESTHESIA:  general, popliteal block  TOURNIQUET TIME: 45 mins  IV FLUIDS AND URINE: See anesthesia.  ESTIMATED BLOOD LOSS: minimal mL.  IMPLANTS: Implant Name Type Inv. Item Serial No. Manufacturer Lot No. LRB No. Used Action  PLATE TUB 1/3 8H - [id number] Plate PLATE TUB 1/3 8H  [employer](ORTH,TRAU,BIO,SG)  Left 1 Implanted  SCREW NON-LOCK 3.5X10 - [id number] Screw SCREW NON-LOCK 3.5X10  [employer](ORTH,TRAU,BIO,SG)  Left 3 Implanted  SCREW NON-LOCK 3.5X12 - [id number] Screw SCREW NON-LOCK 3.5X12  [employer](ORTH,TRAU,BIO,SG)  Left 1 Implanted and Explanted  SCREW LOCK MDS 3.5X12 - [id number] Screw SCREW LOCK MDS 3.5X12  [employer](ORTH,TRAU,BIO,SG)  Left 2 Implanted  SCREW LOCK MDS 3.5X16 - [id number] Screw SCREW LOCK MDS 3.5X16  [employer](ORTH,TRAU,BIO,SG)  Left 1 Implanted  SCREW LOCK MDS 3.5X18 - [id number] Screw SCREW LOCK MDS 3.5X18  [employer](ORTH,TRAU,BIO,SG)  Left 1 Implanted and Explanted  SCREW CANN FT 4.0X38MM - [id number] Screw SCREW CANN FT 4.0X38MM  [employer](ORTH,TRAU,BIO,SG)  Left 2 Implanted    COMPLICATIONS: see description of procedure.  DESCRIPTION OF PROCEDURE: The patient was brought to the operating room and placed supine on the operating table.  The patient had been signed prior  to the procedure and this was documented. The patient had the anesthesia placed by the anesthesiologist.  A nonsterile tourniquet was placed on the upper thigh.  The prep verification and incision time-outs were performed to confirm that this was the correct patient, site, side and location. The patient had an SCD on the opposite lower extremity. The patient did receive antibiotics prior to the incision and was re-dosed during the procedure as needed at indicated intervals.  The patient had the lower extremity prepped and draped in the standard surgical fashion.  The extremity was exsanguinated using an esmarch bandage and the tourniquet was inflated to 300 mm Hg.  An incision was created over the lateral aspect of the distal fibula.  Full-thickness flaps were raised off of the fibula.  Subperiosteal elevation was performed.  Fracture was visualized.  It was a short oblique pattern which was not amenable to like fixation.  Bone clamp was used to reduce the fracture which was confirmed under fluoroscopy.  Semitubular plate was placed on top of the fibula and nonlocking and locking screws were used to secure the plate down.  The plate contoured very nicely.  Clamp was removed.  The fracture remained reduced.  This was checked under fluoroscopy on orthogonal views.  Attention was then turned to the medial malleolus fracture.  A separate incision was made.  Dissection was carried down through the subcutaneous tissue.  Branches of the saphenous vein were mobilized and some were cauterized and slept the side.  Fracture was then visualized.  Entrapped periosteum was removed from the fracture site.  Fracture was then reduced and clamped with a tenaculum.  2 parallel K wires were advanced across  the fracture using fluoroscopic guidance.  2 fully threaded 38 mm 4.0 mm cannulated screw were placed over the K wire.  Each screw had excellent fixation.  Stress view of the ankle showed no widening of the medial clear space or the  syndesmosis interval.  Final x-rays were taken.  Surgical sites were thoroughly irrigated and closed in a layered fashion.  Sterile dressings were applied.  Short leg splint was placed.  Patient tolerated the procedure well had no any complications.  Ashlee Farmer, my PA, was a medical necessity for the entirety of the surgery including opening, closing, limb positioning, retracting, exposing, and repairing.  POSTOPERATIVE PLAN: Ashlee Farmer will remain nonweightbearing on this leg for approximately 6 weeks; Ashlee Farmer will return for suture removal in 2 weeks.  He will be immobilized in a short leg splint and then transitioned to a CAM walker at his first follow up appointment.  Ashlee Farmer will receive DVT prophylaxis based on other medications, activity level, and risk ratio of bleeding to thrombosis.  Ashlee Reel, MD [facility] 2:06 PM

## 2023-01-09 ENCOUNTER — Encounter (HOSPITAL_COMMUNITY): Payer: Self-pay | Admitting: Orthopaedic Surgery

## 2023-01-09 DIAGNOSIS — S82852A Displaced trimalleolar fracture of left lower leg, initial encounter for closed fracture: Secondary | ICD-10-CM | POA: Diagnosis not present

## 2023-01-09 MED ORDER — MUPIROCIN 2 % EX OINT
1.0000 | TOPICAL_OINTMENT | Freq: Two times a day (BID) | CUTANEOUS | Status: DC
  Administered 2023-01-09 – 2023-01-10 (×3): 1 via NASAL
  Filled 2023-01-09: qty 22

## 2023-01-09 MED ORDER — CHLORHEXIDINE GLUCONATE CLOTH 2 % EX PADS
6.0000 | MEDICATED_PAD | Freq: Every day | CUTANEOUS | Status: DC
  Administered 2023-01-10: 6 via TOPICAL

## 2023-01-09 MED ORDER — RIVAROXABAN 10 MG PO TABS: 10.0000 mg | ORAL_TABLET | Freq: Every day | ORAL | 0 refills | Status: DC

## 2023-01-09 MED ORDER — HYDROCODONE-ACETAMINOPHEN 5-325 MG PO TABS: 1.0000 | ORAL_TABLET | Freq: Three times a day (TID) | ORAL | 0 refills | Status: DC | PRN

## 2023-01-09 NOTE — Discharge Summary (Signed)
Patient ID: Ashlee Farmer MRN: [id number] DOB/AGE: 54-06-1970 55 y.o.  Admit date: 01/08/2023 Discharge date: 01/10/2023  Admission Diagnoses:  Principal Problem:   Bimalleolar ankle fracture, left, closed, initial encounter Active Problems:   History of open reduction and internal fixation (ORIF) procedure   Discharge Diagnoses:  Same  Past Medical History:  Diagnosis Date   Allergy    Anxiety    Depression    Endometriosis of bladder    Family history of adverse reaction to anesthesia    post op nausea vomiting   GERD (gastroesophageal reflux disease)    Heart murmur    Hypertension    IBS (irritable bowel syndrome)    Pulmonary embolism (HCC) 08/29/2020    Surgeries: Procedure(s): OPEN REDUCTION INTERNAL FIXATION (ORIF) LEFT BIMALLEOLAR ANKLE FRACTURE on 01/08/2023   Consultants:   Discharged Condition: Improved  Hospital Course: Ashlee Farmer is an 52 y.o. female who was admitted 01/08/2023 for operative treatment ofBimalleolar ankle fracture, left, closed, initial encounter. Patient has severe unremitting pain that affects sleep, daily activities, and work/hobbies. After pre-op clearance the patient was taken to the operating room on 01/08/2023 and underwent  Procedure(s): OPEN REDUCTION INTERNAL FIXATION (ORIF) LEFT BIMALLEOLAR ANKLE FRACTURE.    Patient was given perioperative antibiotics:  Anti-infectives (From admission, onward)    Start     Dose/Rate Route Frequency Ordered Stop   01/08/23 1900  ceFAZolin (ANCEF) IVPB 2g/100 mL premix        2 g 200 mL/hr over 30 Minutes Intravenous Every 6 hours 01/08/23 1643 01/09/23 0649   01/08/23 1200  ceFAZolin (ANCEF) 3 g in sodium chloride 0.9 % 100 mL IVPB  Status:  Discontinued        3 g 200 mL/hr over 30 Minutes Intravenous On call to O.R. 01/08/23 0959 01/08/23 1636   01/08/23 1003  sodium chloride 0.9 % with ceFAZolin (ANCEF) ADS Med       Note to Pharmacy: Lacie Draft A: cabinet override       01/08/23 1003 01/08/23 1702        Patient was given sequential compression devices, early ambulation, and chemoprophylaxis to prevent DVT.  Patient benefited maximally from hospital stay and there were no complications.    Recent vital signs: Patient Vitals for the past 24 hrs:  BP Temp Temp src Pulse Resp SpO2  01/10/23 0728 134/76 98.3 F (36.8 C) Oral 79 -- 97 %  01/10/23 0440 (!) 159/85 98.4 F (36.9 C) Oral 75 18 97 %  01/09/23 2018 (!) 150/84 98.7 F (37.1 C) Oral 76 20 96 %  01/09/23 1341 (!) 141/79 98.2 F (36.8 C) Oral 89 16 96 %  01/09/23 1043 (!) 140/83 -- -- 87 -- 95 %     Recent laboratory studies:  Recent Labs    01/08/23 1038  WBC 10.5  HGB 12.6  HCT 39.9  PLT 442*  NA 135  K 3.7  CL 100  CO2 21*  BUN 23*  CREATININE 0.91  GLUCOSE 101*  CALCIUM 9.7     Discharge Medications:   Allergies as of 01/10/2023   No Known Allergies      Medication List     STOP taking these medications    acetaminophen 500 MG tablet Commonly known as: TYLENOL   aspirin EC 81 MG tablet   ibuprofen 600 MG tablet Commonly known as: ADVIL       TAKE these medications    amLODipine 10 MG tablet Commonly known as: NORVASC  Take 1 tablet (10 mg total) by mouth daily.   hydrALAZINE 25 MG tablet Commonly known as: APRESOLINE Take 1 tablet (25 mg total) by mouth every 8 (eight) hours. What changed: when to take this   HYDROcodone-acetaminophen 5-325 MG tablet Commonly known as: Norco Take 1-2 tablets by mouth 3 (three) times daily as needed.   losartan 100 MG tablet Commonly known as: COZAAR Take 1 tablet (100 mg total) by mouth daily.   ondansetron 4 MG tablet Commonly known as: Zofran Take 1 tablet (4 mg total) by mouth every 8 (eight) hours as needed for nausea or vomiting.   pantoprazole 40 MG tablet Commonly known as: PROTONIX Take 1 tablet (40 mg total) by mouth 2 (two) times daily. What changed: when to take this   Refresh Tears PF 0.5-0.9  % Soln Generic drug: Carboxymethylcell-Glycerin PF Place 1 drop into both eyes daily as needed (Dry eye).   rivaroxaban 10 MG Tabs tablet Commonly known as: XARELTO Take 1 tablet (10 mg total) by mouth daily. To be taken after surgery to prevent blood clots   simvastatin 20 MG tablet Commonly known as: ZOCOR Take 1 tablet (20 mg total) by mouth once daily at 6 PM.               Durable Medical Equipment  (From admission, onward)           Start     Ordered   01/08/23 1644  DME Walker rolling  Once       Question:  Patient needs a walker to treat with the following condition  Answer:  History of open reduction and internal fixation (ORIF) procedure   01/08/23 1643   01/08/23 1644  DME 3 n 1  Once        01/08/23 1643   01/08/23 1644  DME Bedside commode  Once       Question:  Patient needs a bedside commode to treat with the following condition  Answer:  History of open reduction and internal fixation (ORIF) procedure   01/08/23 1643            Diagnostic Studies: DG Ankle 2 Views Left Result Date: 01/08/2023 CLINICAL DATA:  Elective surgery. EXAM: LEFT ANKLE - 2 VIEW COMPARISON:  Preoperative imaging FINDINGS: Two fluoroscopic spot views of the left ankle submitted from the operating room. Lateral plate and screw fixation of distal fibular fracture. Two screws traverse the medial malleolar fracture. Fluoroscopy time 52 seconds. Fluoroscopy dose 0.92 mGy. IMPRESSION: Intraoperative fluoroscopy during ankle fracture fixation. Electronically Signed   By: Narda Rutherford M.D.   On: 01/08/2023 17:43   DG C-Arm 1-60 Min-No Report Result Date: 01/08/2023 Fluoroscopy was utilized by the requesting physician.  No radiographic interpretation.   DG C-Arm 1-60 Min-No Report Result Date: 01/08/2023 Fluoroscopy was utilized by the requesting physician.  No radiographic interpretation.   DG Ankle Complete Left Result Date: 12/29/2022 CLINICAL DATA:  Fall EXAM: LEFT ANKLE  COMPLETE - 3+ VIEW COMPARISON:  None Available. FINDINGS: Transverse fracture noted through the medial malleolus. Oblique fracture through the distal fibula. Fractures are mildly displaced. No subluxation or dislocation. IMPRESSION: Bimalleolar fracture. Electronically Signed   By: Charlett Nose M.D.   On: 12/29/2022 19:02    Disposition: Discharge disposition: 01-Home or Self Care          Follow-up Information     Georganna Skeans, MD Follow up.   Specialty: Family Medicine Contact information: [phone] [address] [place]  [address] [phone]         Cristie Hem, PA-C. Schedule an appointment as soon as possible for a visit in 2 week(s).   Specialty: Orthopedic Surgery Contact information: [address] [place] [address] [phone]         [facility] Follow up.   Specialty: Home Health Services Why: [facility] will provide home  health PT and OT services, start of  care within  48 hours post discharge Contact information: [address] [place] [address] [phone]                  Signed: Cristie Hem 01/10/2023, 8:17 AM

## 2023-01-09 NOTE — [place] FL2 (Signed)
[place] MEDICAID FL2 LEVEL OF CARE FORM     IDENTIFICATION  Patient Name: Ashlee Farmer Birthdate: October 17, 1970 Sex: female Admission Date (Current Location): 01/08/2023  [place] and [place] Number:  Haynes Bast [id number] Facility and Address:  [facility], [address] [place], [place] [address]      Provider Number: [id number]  Attending Physician Name and Address:  Tarry Kos, MD  Relative Name and Phone Number:  Jaselynn, Garma   [phone]    Current Level of Care: Hospital Recommended Level of Care: Skilled Nursing Facility Prior Approval Number:    Date Approved/Denied:   PASRR Number: [id number]  Discharge Plan: SNF    Current Diagnoses: Patient Active Problem List   Diagnosis Date Noted   Bimalleolar ankle fracture, left, closed, initial encounter 01/08/2023   History of open reduction and internal fixation (ORIF) procedure 01/08/2023   Pain, dental 05/30/2021   Hypertensive crisis 08/29/2020   Class 3 severe obesity due to excess calories with serious comorbidity and body mass index (BMI) of 50.0 to 59.9 in adult [facility]) 08/03/2014   Essential hypertension 08/03/2014    Orientation RESPIRATION BLADDER Height & Weight     Self, Time, Situation, Place  O2 Continent Weight: 270 lb (122.5 kg) Height:  5\' 1"  (154.9 cm)  BEHAVIORAL SYMPTOMS/MOOD NEUROLOGICAL BOWEL NUTRITION STATUS      Continent Diet (see discharge summary)  AMBULATORY STATUS COMMUNICATION OF NEEDS Skin   Total Care Verbally Surgical wounds                       Personal Care Assistance Level of Assistance  Bathing, Feeding, Dressing Bathing Assistance: Limited assistance Feeding assistance: Independent Dressing Assistance: Limited assistance     Functional Limitations Info  Sight, Hearing, Speech Sight Info: Adequate Hearing Info: Adequate Speech Info: Adequate    SPECIAL CARE FACTORS FREQUENCY  PT (By licensed PT), OT (By licensed OT)      PT Frequency: 5x week OT Frequency: 5x week            Contractures Contractures Info: Not present    Additional Factors Info  Code Status, Allergies Code Status Info: full Allergies Info: NKA           Current Medications (01/09/2023):  This is the current hospital active medication list Current Facility-Administered Medications  Medication Dose Route Frequency Provider Last Rate Last Admin   acetaminophen (TYLENOL) tablet 325-650 mg  325-650 mg Oral Q6H PRN Tarry Kos, MD       amLODipine (NORVASC) tablet 10 mg  10 mg Oral Daily Tarry Kos, MD   10 mg at 01/09/23 1045   Chlorhexidine Gluconate Cloth 2 % PADS 6 each  6 each Topical Q0600 Tarry Kos, MD       diphenhydrAMINE (BENADRYL) 12.5 MG/5ML elixir 25 mg  25 mg Oral Q4H PRN Tarry Kos, MD       docusate sodium (COLACE) capsule 100 mg  100 mg Oral BID Tarry Kos, MD       hydrALAZINE (APRESOLINE) tablet 25 mg  25 mg Oral Daily Tarry Kos, MD   25 mg at 01/09/23 1045   HYDROmorphone (DILAUDID) injection 0.5-1 mg  0.5-1 mg Intravenous Q4H PRN Tarry Kos, MD       losartan (COZAAR) tablet 100 mg  100 mg Oral Daily Tarry Kos, MD   100 mg at 01/09/23 1044   magnesium citrate solution 1 Bottle  1 Bottle Oral Once PRN Tarry Kos, MD       methocarbamol (ROBAXIN) tablet 500 mg  500 mg Oral Q6H PRN Tarry Kos, MD   500 mg at 01/09/23 1232   Or   methocarbamol (ROBAXIN) injection 500 mg  500 mg Intravenous Q6H PRN Tarry Kos, MD       metoCLOPramide (REGLAN) tablet 5-10 mg  5-10 mg Oral Q8H PRN Tarry Kos, MD       Or   metoCLOPramide (REGLAN) injection 5-10 mg  5-10 mg Intravenous Q8H PRN Tarry Kos, MD       mupirocin ointment (BACTROBAN) 2 % 1 Application  1 Application Nasal BID Tarry Kos, MD   1 Application at 01/09/23 1232   ondansetron (ZOFRAN) tablet 4 mg  4 mg Oral Q6H PRN Tarry Kos, MD       Or   ondansetron [facility]) injection 4 mg  4 mg Intravenous Q6H PRN Tarry Kos,  MD       oxyCODONE (Oxy IR/ROXICODONE) immediate release tablet 10-15 mg  10-15 mg Oral Q4H PRN Tarry Kos, MD   15 mg at 01/09/23 1610   oxyCODONE (Oxy IR/ROXICODONE) immediate release tablet 5-10 mg  5-10 mg Oral Q4H PRN Tarry Kos, MD   10 mg at 01/09/23 1045   polyethylene glycol (MIRALAX / GLYCOLAX) packet 17 g  17 g Oral Daily PRN Tarry Kos, MD       rivaroxaban Carlena Hurl) tablet 10 mg  10 mg Oral Daily Tarry Kos, MD   10 mg at 01/09/23 1045   sorbitol 70 % solution 30 mL  30 mL Oral Daily PRN Tarry Kos, MD         Discharge Medications: Please see discharge summary for a list of discharge medications.  Relevant Imaging Results:  Relevant Lab Results:   Additional Information SSN: [id number]  Lorri Frederick, LCSW

## 2023-01-09 NOTE — Progress Notes (Signed)
L ANKLE FX    01/08/23 1645  TOC Brief Assessment  Insurance and Status Reviewed  Patient has primary care physician Yes  Home environment has been reviewed From home with mom. Pt states mom has dementia.  Prior level of function: PTA independent with ADL's, no DME usage  Prior/Current Home Services No current home services  Social Drivers of Health Review SDOH reviewed no interventions necessary  Readmission risk has been reviewed No  Transition of care needs transition of care needs identified, TOC will continue to follow (s/p ORIF L ANKLE FRACTURE on 01/08/2023)   Pt from home with mom. States mom with dementia. Has sister that works, assistance limited. NCM spoke with pt regarding d/c planning  pt states she doesn't have the help @ home to assist with her care.  Preference: SNF placement. CSW aware. Therapy notes pending...  Referral made with [facility] ( backup plan) for home health services and accepted pending MD's order.  TOC team following and will assist with needs. Gae Gallop RN,BSN,CM [phone]

## 2023-01-09 NOTE — TOC Initial Note (Addendum)
Transition of Care [facility]) - Initial/Assessment Note    Patient Details  Name: Ashlee Farmer MRN: [id number] Date of Birth: [birth year]  [facility] CM/SW Contact:    Lorri Frederick, LCSW Phone Number: 01/09/2023, 1:20 PM  Clinical Narrative:       CSW and RNCM met with pt regarding DC plan.  Pt from home with her mother who has dementia.  Sister lives nearby and does help some but also works and not available full time.  Options were discussed: HH, paid caregivers, family assistance.  Pt states there is not enough support from family and she cannot afford paid caregiver.  Pt stating she cannot manage ADLs without daily assistance, wants to pursue SNF placement.    Permission given to send out referral in hub.  Referral sent out in hub for SNF.         1500: No bed offers, CSW spoke with [facility] who confirmed pt medicaid does not cover SNF stay.  CSW updated pt and she will talk with her sister about how to "make it work" with DC home.   Expected Discharge Plan: Home w Home Health Services Barriers to Discharge: SNF Pending bed offer   Patient Goals and CMS Choice     Choice offered to / list presented to : Patient      Expected Discharge Plan and Services In-house Referral: Clinical Social Work   Post Acute Care Choice: Skilled Nursing Facility Living arrangements for the past 2 months: Single Family Home Expected Discharge Date: 01/09/23                         HH Arranged: PT, OT HH Agency: [facility] Date HH Agency Contacted: 01/09/23 Time HH Agency Contacted: 1050 Representative spoke with at [facility] Agency: Tresa Endo  Prior Living Arrangements/Services Living arrangements for the past 2 months: Single Family Home Lives with:: Parents (with mother) Patient language and need for interpreter reviewed:: Yes        Need for Family Participation in Patient Care: No (Comment) Care giver support system in place?: Yes (comment)    Criminal Activity/Legal Involvement Pertinent to Current Situation/Hospitalization: No - Comment as needed  Activities of Daily Living   ADL Screening (condition at time of admission) Independently performs ADLs?: No Does the patient have a NEW difficulty with bathing/dressing/toileting/self-feeding that is expected to last >3 days?: No Does the patient have a NEW difficulty with getting in/out of bed, walking, or climbing stairs that is expected to last >3 days?: No Does the patient have a NEW difficulty with communication that is expected to last >3 days?: No Is the patient deaf or have difficulty hearing?: No Does the patient have difficulty seeing, even when wearing glasses/contacts?: No Does the patient have difficulty concentrating, remembering, or making decisions?: No  Permission Sought/Granted   Permission granted to share information with : Yes, Verbal Permission Granted     Permission granted to share info w AGENCY: SNF        Emotional Assessment Appearance:: Appears stated age Attitude/Demeanor/Rapport: Engaged Affect (typically observed): Appropriate, Pleasant Orientation: : Oriented to Self, Oriented to Place, Oriented to  Time, Oriented to Situation      Admission diagnosis:  History of open reduction and internal fixation (ORIF) procedure [Z98.890] Patient Active Problem List   Diagnosis Date Noted   Bimalleolar ankle fracture, left, closed, initial encounter 01/08/2023   History of open reduction and internal fixation (ORIF) procedure  01/08/2023   Pain, dental 05/30/2021   Hypertensive crisis 08/29/2020   Class 3 severe obesity due to excess calories with serious comorbidity and body mass index (BMI) of 50.0 to 59.9 in adult [facility]) 08/03/2014   Essential hypertension 08/03/2014   PCP:  Georganna Skeans, MD Pharmacy:   [id number] [place], [place] - [address] [place] [address] Phone: [phone] Fax:  [phone]     Social Drivers of Health (SDOH) Social History: SDOH Screenings   Food Insecurity: Patient Declined (01/09/2023)  Housing: Patient Declined (01/09/2023)  Transportation Needs: No Transportation Needs (01/09/2023)  Utilities: Not At Risk (01/09/2023)  Depression (PHQ2-9): Low Risk  (11/23/2022)  Tobacco Use: Low Risk  (01/08/2023)   SDOH Interventions:     Readmission Risk Interventions     No data to display

## 2023-01-09 NOTE — Evaluation (Signed)
Physical Therapy Evaluation Patient Details Name: Ashlee Farmer MRN: [id number] DOB: 1971/01/04 Today's Date: 01/09/2023  History of Present Illness  Pt is a 52 y/o female presenting on 12/16 for surgical treatment of left bimalleolar ankle fx now s/p ORIF. Significant PMH: obesity.  Clinical Impression  Pt admitted with above. Since left ankle fracture, pt has been having difficulty with ADL's and mobility due to weightbearing precautions. Pt lives with her mother, for whom she is the caregiver and has limited assist available from her sister. On PT evaluation, pt is unable to stand, but can perform a lateral scoot towards her right side to a wheelchair with good adherence to weightbearing precautions. Verbalizes fair pain control with premedication. PT assisted with stabilizing wheelchair. Pt propelling w/c a limited distance before fatigue. Due to deficits and decreased caregiver support, currently recommending continued inpatient follow up therapy, <3 hours/day.       If plan is discharge home, recommend the following: A little help with walking and/or transfers;A little help with bathing/dressing/bathroom;Assistance with cooking/housework;Assist for transportation;Help with stairs or ramp for entrance   Can travel by private vehicle   Yes    Equipment Recommendations None recommended by PT (pt equipped)  Recommendations for Other Services       Functional Status Assessment Patient has had a recent decline in their functional status and demonstrates the ability to make significant improvements in function in a reasonable and predictable amount of time.     Precautions / Restrictions Precautions Precautions: Fall Restrictions Weight Bearing Restrictions Per Provider Order: Yes LLE Weight Bearing Per Provider Order: Non weight bearing      Mobility  Bed Mobility Overal bed mobility: Needs Assistance Bed Mobility: Supine to Sit     Supine to sit: Supervision     General  bed mobility comments: HOB elevated    Transfers Overall transfer level: Needs assistance Equipment used: None Transfers: Bed to chair/wheelchair/BSC            Lateral/Scoot Transfers: Contact guard assist General transfer comment: PT stabilized wheelchair, pt performed lateral scoot from bed to w/c towards right, verbal cues provided for hand placement, multiple scoots needed to transfer over. Heavy use of arms good adherence to weightbearing precautions    Ambulation/Gait               General Gait Details: unable  Administrator mobility: Yes Wheelchair propulsion: Both upper extremities Wheelchair parts: Supervision/cueing Distance: 30 Wheelchair Assistance Details (indicate cue type and reason): Propelled with BUE's, cues for technique and propulsion, fatigues quickly   Tilt Bed    Modified Rankin (Stroke Patients Only)       Balance Overall balance assessment: Needs assistance Sitting-balance support: Feet supported Sitting balance-Leahy Scale: Fair                                       Pertinent Vitals/Pain Pain Assessment Pain Assessment: Faces Faces Pain Scale: Hurts little more Pain Location: L ankle Pain Descriptors / Indicators: Operative site guarding, Grimacing Pain Intervention(s): Limited activity within patient's tolerance, Monitored during session, Premedicated before session    Home Living Family/patient expects to be discharged to:: Private residence Living Arrangements: Parent Available Help at Discharge: Family;Available PRN/intermittently (sister can come once a day) Type of Home: House Home Access: Stairs to enter  Entrance Stairs-Number of Steps: 2   Home Layout: Able to live on main level with bedroom/bathroom Home Equipment: BSC/3in1;Wheelchair - manual (drop arm BSC)      Prior Function Prior Level of Function : Needs assist              Mobility Comments: using w/c ADLs Comments: sponge bathing and sister helping wash her hair in the sink and with cleaning backside after toileting     Extremity/Trunk Assessment   Upper Extremity Assessment Upper Extremity Assessment: Defer to OT evaluation    Lower Extremity Assessment Lower Extremity Assessment: LLE deficits/detail LLE Deficits / Details: ankle fx s/p ORIF. Able to perform limited SLR, wiggle toes    Cervical / Trunk Assessment Cervical / Trunk Assessment: Other exceptions Cervical / Trunk Exceptions: increased body habitus  Communication   Communication Communication: No apparent difficulties  Cognition Arousal: Alert Behavior During Therapy: WFL for tasks assessed/performed Overall Cognitive Status: Within Functional Limits for tasks assessed                                          General Comments      Exercises     Assessment/Plan    PT Assessment Patient needs continued PT services  PT Problem List Decreased strength;Decreased activity tolerance;Decreased balance;Decreased mobility;Pain       PT Treatment Interventions DME instruction;Functional mobility training;Therapeutic activities;Therapeutic exercise;Balance training;Patient/family education;Wheelchair mobility training    PT Goals (Current goals can be found in the Care Plan section)  Acute Rehab PT Goals Patient Stated Goal: go to SNF PT Goal Formulation: With patient Time For Goal Achievement: 01/23/23 Potential to Achieve Goals: Good    Frequency Min 1X/week     Co-evaluation               AM-PAC PT "6 Clicks" Mobility  Outcome Measure Help needed turning from your back to your side while in a flat bed without using bedrails?: None Help needed moving from lying on your back to sitting on the side of a flat bed without using bedrails?: A Little Help needed moving to and from a bed to a chair (including a wheelchair)?: A Little Help needed standing  up from a chair using your arms (e.g., wheelchair or bedside chair)?: A Lot Help needed to walk in hospital room?: Total Help needed climbing 3-5 steps with a railing? : Total 6 Click Score: 14    End of Session   Activity Tolerance: Patient tolerated treatment well Patient left: with call bell/phone within reach;Other (comment) (with OT) Nurse Communication: Mobility status PT Visit Diagnosis: Pain;Other abnormalities of gait and mobility (R26.89) Pain - Right/Left: Left Pain - part of body: Ankle and joints of foot    Time: 7425-9563 PT Time Calculation (min) (ACUTE ONLY): 30 min   Charges:   PT Evaluation $PT Eval Low Complexity: 1 Low PT Treatments $Therapeutic Activity: 8-22 mins PT General Charges $$ ACUTE PT VISIT: 1 Visit         Lillia Pauls, PT, DPT Acute Rehabilitation Services Office [phone]   Norval Morton 01/09/2023, 1:04 PM

## 2023-01-09 NOTE — TOC CAGE-AID Note (Signed)
Transition of Care [facility]) - CAGE-AID Screening   Patient Details  Name: Ashlee Farmer MRN: [id number] Date of Birth: May 31, 1970  [facility]) CM/SW Contact:    Janora Norlander, RN Phone Number: [phone] 01/09/2023, 4:05 PM   Clinical Narrative: Pt here after sustaining a L ankle injury after falling down the stairs.  Pt denies alcohol or drug use.  Screening complete.    CAGE-AID Screening:    Have You Ever Felt You Ought to Cut Down on Your Drinking or Drug Use?: No Have People Annoyed You By Critizing Your Drinking Or Drug Use?: No Have You Felt Bad Or Guilty About Your Drinking Or Drug Use?: No Have You Ever Had a Drink or Used Drugs First Thing In The Morning to Steady Your Nerves or to Get Rid of a Hangover?: No CAGE-AID Score: 0  Substance Abuse Education Offered: No

## 2023-01-09 NOTE — Evaluation (Signed)
Occupational Therapy Evaluation Patient Details Name: Ashlee Farmer MRN: [id number] DOB: 1970/11/23 Today's Date: 01/09/2023   History of Present Illness Pt is a 52 y/o female presenting on 12/16 for surgical treatment of left bimalleolar ankle fx now s/p ORIF. Significant PMH: obesity.   Clinical Impression   PTA patient reports needing assist to manage ADLS since L ankle fx, specifically LB dressing, toileting, and bathing.  She reports her sister comes daily (typically) to assist with toilet transfers and toileting, otherwise she is using depends for toleting needs.  Educated on AE and issued reacher, long sponge and toileting aide; discussed compensatory techniques for ADLs, transfers for increased independence but pt hesitant about using compensatory techniques at home. Today, she requires min assist for lateral scoot transfer towards L side (as she has to do off DABSC), setup for UB ADLs and up to min assist for LB ADLS. Due to decreased caregiver assist and difficulty with ADLs, recommend <3hrs/day inpatient setting at dc.  Will follow acutely.       If plan is discharge home, recommend the following: A little help with walking and/or transfers;A lot of help with bathing/dressing/bathroom;Assistance with cooking/housework;Assist for transportation;Help with stairs or ramp for entrance    Functional Status Assessment  Patient has had a recent decline in their functional status and demonstrates the ability to make significant improvements in function in a reasonable and predictable amount of time.  Equipment Recommendations  None recommended by OT    Recommendations for Other Services       Precautions / Restrictions Precautions Precautions: Fall Required Braces or Orthoses: Splint/Cast Splint/Cast: L LE Splint/Cast - Date Prophylactic Dressing Applied (if applicable): 01/08/23 Restrictions Weight Bearing Restrictions Per Provider Order: Yes LLE Weight Bearing Per Provider  Order: Non weight bearing      Mobility Bed Mobility Overal bed mobility: Needs Assistance Bed Mobility: Sit to Supine       Sit to supine: Supervision        Transfers Overall transfer level: Needs assistance Equipment used: None Transfers: Bed to chair/wheelchair/BSC            Lateral/Scoot Transfers: Min assist General transfer comment: lateral scoot from w/c to EOB, min assist for safety with L LE placed on bed and scooting into bed      Balance Overall balance assessment: Needs assistance Sitting-balance support: Feet supported Sitting balance-Leahy Scale: Fair                                     ADL either performed or assessed with clinical judgement   ADL Overall ADL's : Needs assistance/impaired     Grooming: Set up;Sitting           Upper Body Dressing : Set up;Sitting   Lower Body Dressing: Minimal assistance;Sitting/lateral leans Lower Body Dressing Details (indicate cue type and reason): lateral leans with min assist to fully manage clothing, educated on compensatory techniques and use of reacher Toilet Transfer: Minimal assistance Toilet Transfer Details (indicate cue type and reason): lateral scoot simulated from w/c to EOB towards L side   Toileting - Clothing Manipulation Details (indicate cue type and reason): educated on use of toileting aide, issued to patient     Functional mobility during ADLs: Minimal assistance General ADL Comments: pt limited by NWB L LE pain, difficulty managing ADLs due to body habitus     Vision   Vision Assessment?: No apparent  visual deficits     Perception         Praxis         Pertinent Vitals/Pain Pain Assessment Pain Assessment: Faces Faces Pain Scale: Hurts little more Pain Location: L LE Pain Descriptors / Indicators: Operative site guarding, Crying Pain Intervention(s): Limited activity within patient's tolerance, Monitored during session, Repositioned      Extremity/Trunk Assessment Upper Extremity Assessment Upper Extremity Assessment: Defer to OT evaluation   Lower Extremity Assessment Lower Extremity Assessment: LLE deficits/detail LLE Deficits / Details: ankle fx s/p ORIF. Able to perform limited SLR, wiggle toes   Cervical / Trunk Assessment Cervical / Trunk Assessment: Other exceptions Cervical / Trunk Exceptions: increased body habitus   Communication Communication Communication: No apparent difficulties   Cognition Arousal: Alert Behavior During Therapy: Anxious Overall Cognitive Status: Within Functional Limits for tasks assessed                                       General Comments  issued reacher, long sponge and toielting aide;  educated on use of equipment for increased independence with ADls.    Exercises     Shoulder Instructions      Home Living Family/patient expects to be discharged to:: Private residence Living Arrangements: Parent Available Help at Discharge: Family;Available PRN/intermittently (sister can come once a day) Type of Home: House Home Access: Stairs to enter [employer] of Steps: 2   Home Layout: Able to live on main level with bedroom/bathroom         Bathroom Toilet: Standard     Home Equipment: BSC/3in1;Wheelchair - manual (drop arm BSC)          Prior Functioning/Environment Prior Level of Function : Needs assist             Mobility Comments: using w/c ADLs Comments: sponge bathing and sister helping wash her hair in the sink and with cleaning backside after toileting        OT Problem List: Decreased strength;Decreased activity tolerance;Impaired balance (sitting and/or standing);Pain;Obesity;Decreased knowledge of precautions;Decreased knowledge of use of DME or AE;Decreased safety awareness      OT Treatment/Interventions: DME and/or AE instruction;Therapeutic activities;Cognitive remediation/compensation;Balance  training;Patient/family education;Therapeutic exercise;Self-care/ADL training    OT Goals(Current goals can be found in the care plan section) Acute Rehab OT Goals Patient Stated Goal: be able to take care of myself OT Goal Formulation: With patient Time For Goal Achievement: 01/23/23 Potential to Achieve Goals: Good  OT Frequency: Min 1X/week    Co-evaluation              AM-PAC OT "6 Clicks" Daily Activity     Outcome Measure Help from another person eating meals?: None Help from another person taking care of personal grooming?: A Little Help from another person toileting, which includes using toliet, bedpan, or urinal?: A Lot Help from another person bathing (including washing, rinsing, drying)?: A Little Help from another person to put on and taking off regular upper body clothing?: A Little Help from another person to put on and taking off regular lower body clothing?: A Lot 6 Click Score: 17   End of Session Nurse Communication: Mobility status  Activity Tolerance: Patient tolerated treatment well Patient left: in bed;with call bell/phone within reach  OT Visit Diagnosis: Other abnormalities of gait and mobility (R26.89);Muscle weakness (generalized) (M62.81);Pain Pain - Right/Left: Left Pain - part of body: Leg;Ankle  and joints of foot                Time: 1610-9604 OT Time Calculation (min): 38 min Charges:  OT General Charges $OT Visit: 1 Visit OT Evaluation $OT Eval Moderate Complexity: 1 Mod OT Treatments $Self Care/Home Management : 8-22 mins  Barry Brunner, OT Acute Rehabilitation Services Office [phone]   Chancy Milroy 01/09/2023, 1:27 PM

## 2023-01-09 NOTE — Progress Notes (Signed)
Subjective: 1 Day Post-Op Procedure(s) (LRB): OPEN REDUCTION INTERNAL FIXATION (ORIF) LEFT BIMALLEOLAR ANKLE FRACTURE (Left) Patient reports pain as mild.  No complaints this am.   Objective: Vital signs in last 24 hours: Temp:  [97.5 F (36.4 C)-98.7 F (37.1 C)] 98.1 F (36.7 C) (12/17 0738) Pulse Rate:  [85-96] 86 (12/17 0738) Resp:  [13-20] 18 (12/17 0738) BP: (93-152)/(45-88) 137/64 (12/17 0738) SpO2:  [90 %-97 %] 95 % (12/17 0738) Weight:  [122.5 kg] 122.5 kg (12/16 1022)  Intake/Output from previous day: 12/16 0701 - 12/17 0700 In: 1020 [P.O.:220; I.V.:700; IV Piggyback:100] Out: 805 [Urine:800; Blood:5] Intake/Output this shift: No intake/output data recorded.  Recent Labs    01/08/23 1038  HGB 12.6   Recent Labs    01/08/23 1038  WBC 10.5  RBC 4.38  HCT 39.9  PLT 442*   Recent Labs    01/08/23 1038  NA 135  K 3.7  CL 100  CO2 21*  BUN 23*  CREATININE 0.91  GLUCOSE 101*  CALCIUM 9.7   No results for input(s): "LABPT", "INR" in the last 72 hours.  Neurologically intact Neurovascular intact Sensation intact distally Dorsiflexion/Plantar flexion intact Well-fitting splint in place.  Able to wiggle toes.     Assessment/Plan: 1 Day Post-Op Procedure(s) (LRB): OPEN REDUCTION INTERNAL FIXATION (ORIF) LEFT BIMALLEOLAR ANKLE FRACTURE (Left) Up with therapy Discharge to SNF once insurance approves- patient lives alone and does not feel safe dc home alone.  Says sister unable to keep coming over to help at night.   LLE- NWB.  Ice and elevate.       Cristie Hem 01/09/2023, 7:58 AM

## 2023-01-09 NOTE — Plan of Care (Signed)
  Problem: Pain Management: Goal: General experience of comfort will improve Outcome: Progressing   Problem: Safety: Goal: Ability to remain free from injury will improve Outcome: Progressing

## 2023-01-09 NOTE — Anesthesia Postprocedure Evaluation (Signed)
Anesthesia Post Note  Patient: Ashlee Farmer  Procedure(s) Performed: OPEN REDUCTION INTERNAL FIXATION (ORIF) LEFT BIMALLEOLAR ANKLE FRACTURE (Left: Ankle)     Patient location during evaluation: PACU Anesthesia Type: Regional and General Level of consciousness: awake and alert Pain management: pain level controlled Vital Signs Assessment: post-procedure vital signs reviewed and stable Respiratory status: spontaneous breathing, nonlabored ventilation, respiratory function stable and patient connected to nasal cannula oxygen Cardiovascular status: blood pressure returned to baseline and stable Postop Assessment: no apparent nausea or vomiting Anesthetic complications: no  No notable events documented.  Last Vitals:  Vitals:   01/09/23 0738 01/09/23 1043  BP: 137/64 (!) 140/83  Pulse: 86 87  Resp: 18   Temp: 36.7 C   SpO2: 95% 95%    Last Pain:  Vitals:   01/09/23 1047  TempSrc:   PainSc: 6                  Marshal Schrecengost L Zyriah Mask

## 2023-01-10 ENCOUNTER — Other Ambulatory Visit (HOSPITAL_COMMUNITY): Payer: Self-pay

## 2023-01-10 DIAGNOSIS — S82852A Displaced trimalleolar fracture of left lower leg, initial encounter for closed fracture: Secondary | ICD-10-CM | POA: Diagnosis not present

## 2023-01-10 MED ORDER — HYDROCODONE-ACETAMINOPHEN 5-325 MG PO TABS
1.0000 | ORAL_TABLET | Freq: Three times a day (TID) | ORAL | 0 refills | Status: AC | PRN
  Filled 2023-01-10: qty 30, 5d supply, fill #0

## 2023-01-10 MED ORDER — RIVAROXABAN 10 MG PO TABS
10.0000 mg | ORAL_TABLET | Freq: Every day | ORAL | 0 refills | Status: AC
  Filled 2023-01-10: qty 30, 30d supply, fill #0

## 2023-01-10 NOTE — Discharge Planning (Signed)
Patient alert and oriented. IV access removed. Discharge teaching provided by Gaylyn Rong, RN. Patient transported to lobby via volunteer services for private transportation home via sister.

## 2023-01-10 NOTE — Progress Notes (Signed)
OT Cancellation Note  Patient Details Name: Ashlee Farmer MRN: [id number] DOB: Jan 03, 1971   Cancelled Treatment:    Reason Eval/Treat Not Completed: Patient declined, no reason specified (Patient declined OT stating she was tired and wanted to rest before going home today.) [name], OTA Acute Rehabilitation Services  Office [phone]  [name] 01/10/2023, 8:28 AM

## 2023-01-10 NOTE — Progress Notes (Signed)
Subjective: 2 Days Post-Op Procedure(s) (LRB): OPEN REDUCTION INTERNAL FIXATION (ORIF) LEFT BIMALLEOLAR ANKLE FRACTURE (Left) Patient reports pain as mild.    Objective: Vital signs in last 24 hours: Temp:  [98.2 F (36.8 C)-98.7 F (37.1 C)] 98.3 F (36.8 C) (12/18 0728) Pulse Rate:  [75-89] 79 (12/18 0728) Resp:  [16-20] 18 (12/18 0440) BP: (134-159)/(76-85) 134/76 (12/18 0728) SpO2:  [95 %-97 %] 97 % (12/18 0728)  Intake/Output from previous day: 12/17 0701 - 12/18 0700 In: 600 [P.O.:600] Out: 1300 [Urine:1300] Intake/Output this shift: No intake/output data recorded.  Recent Labs    01/08/23 1038  HGB 12.6   Recent Labs    01/08/23 1038  WBC 10.5  RBC 4.38  HCT 39.9  PLT 442*   Recent Labs    01/08/23 1038  NA 135  K 3.7  CL 100  CO2 21*  BUN 23*  CREATININE 0.91  GLUCOSE 101*  CALCIUM 9.7   No results for input(s): "LABPT", "INR" in the last 72 hours.  Neurologically intact Neurovascular intact Sensation intact distally   Assessment/Plan: 2 Days Post-Op Procedure(s) (LRB): OPEN REDUCTION INTERNAL FIXATION (ORIF) LEFT BIMALLEOLAR ANKLE FRACTURE (Left) Up with therapy NWB LLE D/c home today Will resend meds to [facility] 01/10/2023, 8:11 AM

## 2023-01-11 ENCOUNTER — Telehealth: Payer: Self-pay

## 2023-01-11 ENCOUNTER — Telehealth: Payer: Self-pay | Admitting: Orthopaedic Surgery

## 2023-01-11 DIAGNOSIS — K219 Gastro-esophageal reflux disease without esophagitis: Secondary | ICD-10-CM | POA: Diagnosis not present

## 2023-01-11 DIAGNOSIS — E669 Obesity, unspecified: Secondary | ICD-10-CM | POA: Diagnosis not present

## 2023-01-11 DIAGNOSIS — F32A Depression, unspecified: Secondary | ICD-10-CM | POA: Diagnosis not present

## 2023-01-11 DIAGNOSIS — Z86711 Personal history of pulmonary embolism: Secondary | ICD-10-CM | POA: Diagnosis not present

## 2023-01-11 DIAGNOSIS — I10 Essential (primary) hypertension: Secondary | ICD-10-CM | POA: Diagnosis not present

## 2023-01-11 DIAGNOSIS — Z7901 Long term (current) use of anticoagulants: Secondary | ICD-10-CM | POA: Diagnosis not present

## 2023-01-11 DIAGNOSIS — K589 Irritable bowel syndrome without diarrhea: Secondary | ICD-10-CM | POA: Diagnosis not present

## 2023-01-11 DIAGNOSIS — Z79891 Long term (current) use of opiate analgesic: Secondary | ICD-10-CM | POA: Diagnosis not present

## 2023-01-11 DIAGNOSIS — S82852A Displaced trimalleolar fracture of left lower leg, initial encounter for closed fracture: Secondary | ICD-10-CM | POA: Diagnosis not present

## 2023-01-11 DIAGNOSIS — F419 Anxiety disorder, unspecified: Secondary | ICD-10-CM | POA: Diagnosis not present

## 2023-01-11 NOTE — Telephone Encounter (Signed)
Called and gave verbal to Tanner.

## 2023-01-11 NOTE — Transitions of Care (Post Inpatient/ED Visit) (Signed)
   01/11/2023  Name: Ashlee Farmer MRN: [id number] DOB: July 30, 1970  Today's TOC FU Call Status: Today's TOC FU Call Status:: Unsuccessful Call (1st Attempt) Unsuccessful Call (1st Attempt) Date: 01/11/23  Attempted to reach the patient regarding the most recent Inpatient/ED visit.  Follow Up Plan: Additional outreach attempts will be made to reach the patient to complete the Transitions of Care (Post Inpatient/ED visit) call.   Signature  Robyne Peers, RN

## 2023-01-11 NOTE — Telephone Encounter (Signed)
Received call from Addison Lank (PT) with [facility] needing verbal orders for HHPT   1 Wk 1, 0 Wk 1 and 1 wk 6. The number to contact Burgess Estelle is  [phone]      Per Tanner secure voicemail leave a message

## 2023-01-11 NOTE — Transitions of Care (Post Inpatient/ED Visit) (Signed)
   01/11/2023  Name: Ashlee Farmer MRN: [id number] DOB: 1970-04-21  Today's TOC FU Call Status: Today's TOC FU Call Status:: Unsuccessful Call (2nd Attempt) Unsuccessful Call (1st Attempt) Date: 01/11/23 Unsuccessful Call (2nd Attempt) Date: 01/11/23  Attempted to reach the patient regarding the most recent Inpatient/ED visit.  Follow Up Plan: Additional outreach attempts will be made to reach the patient to complete the Transitions of Care (Post Inpatient/ED visit) call.   Signature  Robyne Peers, RN

## 2023-01-12 ENCOUNTER — Telehealth: Payer: Self-pay

## 2023-01-12 NOTE — Transitions of Care (Post Inpatient/ED Visit) (Signed)
   01/12/2023  Name: Ashlee Farmer MRN: [id number] DOB: Nov 18, 1970  Today's TOC FU Call Status: Today's TOC FU Call Status:: Successful TOC FU Call Completed TOC FU Call Complete Date: 01/12/23 Patient's Name and Date of Birth confirmed.  Transition Care Management Follow-up Telephone Call Date of Discharge: 01/10/23 Discharge Facility: [facility]) Type of Discharge: Inpatient Admission Primary Inpatient Discharge Diagnosis:: Bimalleolar ankle fracture, left, closed, How have you been since you were released from the hospital?: Better Any questions or concerns?: No  Items Reviewed: Did you receive and understand the discharge instructions provided?: Yes Medications obtained,verified, and reconciled?: Yes (Medications Reviewed) Any new allergies since your discharge?: No Dietary orders reviewed?: Yes Type of Diet Ordered:: heart healthy Do you have support at home?: Yes People in Home: sibling(s) Name of Support/Comfort Primary Source: sister  Medications Reviewed Today: Medications Reviewed Today   Medications were not reviewed in this encounter     Home Care and Equipment/Supplies: Were Home Health Services Ordered?: Yes Name of Home Health Agency:: [facility] Has Agency set up a time to come to your home?: Yes First Home Health Visit Date: 01/11/23 Any new equipment or medical supplies ordered?: Yes Name of Medical supply agency?: [facility] Were you able to get the equipment/medical supplies?: Yes Do you have any questions related to the use of the equipment/supplies?: No  Functional Questionnaire: Do you need assistance with bathing/showering or dressing?: Yes Do you need assistance with meal preparation?: Yes Do you have difficulty maintaining continence: No Do you need assistance with getting out of bed/getting out of a chair/moving?: Yes Do you have difficulty managing or taking your medications?: No  Follow up appointments reviewed: PCP Follow-up  appointment confirmed?: Yes Date of PCP follow-up appointment?: 02/07/23 Follow-up Provider: Georganna Skeans, MD Specialist Hospital Follow-up appointment confirmed?: Yes Date of Specialist follow-up appointment?: 01/22/23 Follow-Up Specialty Provider:: Cristie Hem Do you need transportation to your follow-up appointment?: No Do you understand care options if your condition(s) worsen?: Yes-patient verbalized understanding    SIGNATURE  Elsie Lincoln, RN Triage Nurse [facility]

## 2023-01-12 NOTE — Telephone Encounter (Signed)
Opened in error

## 2023-01-19 ENCOUNTER — Telehealth: Payer: Self-pay | Admitting: Orthopaedic Surgery

## 2023-01-19 NOTE — Telephone Encounter (Signed)
[facility] called requesting verbal orders to move her Eval to next week due to them not being able to contact the pt, gave them her home number

## 2023-01-22 ENCOUNTER — Ambulatory Visit (INDEPENDENT_AMBULATORY_CARE_PROVIDER_SITE_OTHER): Payer: Medicaid Other | Admitting: Physician Assistant

## 2023-01-22 ENCOUNTER — Other Ambulatory Visit (INDEPENDENT_AMBULATORY_CARE_PROVIDER_SITE_OTHER): Payer: Medicaid Other

## 2023-01-22 ENCOUNTER — Encounter: Payer: Self-pay | Admitting: Physician Assistant

## 2023-01-22 DIAGNOSIS — S82842A Displaced bimalleolar fracture of left lower leg, initial encounter for closed fracture: Secondary | ICD-10-CM | POA: Diagnosis not present

## 2023-01-22 NOTE — Telephone Encounter (Signed)
Fine to do. No number to call [facility] back.

## 2023-01-22 NOTE — Progress Notes (Signed)
Post-Op Visit Note   Patient: Ashlee Farmer           Date of Birth: 1970/05/05           MRN: [id number] Visit Date: 01/22/2023 PCP: Georganna Skeans, MD   Assessment & Plan:  Chief Complaint:  Chief Complaint  Patient presents with   Left Ankle - Follow-up    ORIF bimalleolar ankle fracture 01/08/2023   Visit Diagnoses:  1. Bimalleolar fracture, left, closed, initial encounter     Plan: Patient is a pleasant 52 year old female who comes in today approximately 2 weeks status post ORIF left bimalleolar ankle fracture 01/08/2023.  She has been doing okay.  She has been taking Tylenol for pain.  She has been compliant taking Xarelto for DVT prophylaxis.  She has been compliant nonweightbearing.  Examination of the left ankle reveals well healed surgical incisions with nylon sutures in place.  No evidence of infection or cellulitis.  She does have swelling throughout the ankle.  No calf pain.  She is neurovascularly intact distally.  Today, the sutures were removed and Steri-Strips applied.  She was placed in a cam boot where she will be nonweightbearing for the next 4 weeks.  Ice and elevate for pain and swelling.  Continue with Xarelto for another 2 weeks.  Follow-up with [place] in 4 weeks for repeat evaluation and three-view x-rays of the left ankle.  Call if concerns or questions.  Follow-Up Instructions: Return in about 4 weeks (around 02/19/2023).   Orders:  Orders Placed This Encounter  Procedures   XR Ankle Complete Left   No orders of the defined types were placed in this encounter.   Imaging: XR Ankle Complete Left Result Date: 01/22/2023 X-rays demonstrate stable alignment of the fractures without hardware complication   PMFS History: Patient Active Problem List   Diagnosis Date Noted   Bimalleolar ankle fracture, left, closed, initial encounter 01/08/2023   History of open reduction and internal fixation (ORIF) procedure 01/08/2023   Pain, dental 05/30/2021    Hypertensive crisis 08/29/2020   Class 3 severe obesity due to excess calories with serious comorbidity and body mass index (BMI) of 50.0 to 59.9 in adult (HCC) 08/03/2014   Essential hypertension 08/03/2014   Past Medical History:  Diagnosis Date   Allergy    Anxiety    Depression    Endometriosis of bladder    Family history of adverse reaction to anesthesia    post op nausea vomiting   GERD (gastroesophageal reflux disease)    Heart murmur    Hypertension    IBS (irritable bowel syndrome)    Pulmonary embolism (HCC) 08/29/2020    Family History  Problem Relation Age of Onset   Diabetes Mother    Hyperlipidemia Mother    Hypertension Mother    Diabetes Father    Heart disease Father    Hyperlipidemia Father    Hypertension Father    Hyperlipidemia Brother    Hypertension Brother     Past Surgical History:  Procedure Laterality Date   ABLATION ON ENDOMETRIOSIS     ORIF ANKLE FRACTURE Left 01/08/2023   Procedure: OPEN REDUCTION INTERNAL FIXATION (ORIF) LEFT BIMALLEOLAR ANKLE FRACTURE;  Surgeon: Tarry Kos, MD;  Location: [facility] OR;  Service: Orthopedics;  Laterality: Left;   Social History   Occupational History   Not on file  Tobacco Use   Smoking status: Never   Smokeless tobacco: Never  Vaping Use   Vaping status: Never Used  Substance  and Sexual Activity   Alcohol use: Not Currently   Drug use: No   Sexual activity: Not Currently

## 2023-01-22 NOTE — Telephone Encounter (Signed)
Notified Amy with [facility] via email.

## 2023-01-24 DIAGNOSIS — Z419 Encounter for procedure for purposes other than remedying health state, unspecified: Secondary | ICD-10-CM | POA: Diagnosis not present

## 2023-01-24 IMAGING — DX DG CHEST 2V
2 series · 2 of 2 positions shown · non-contrast
Comparison: 09/01/2020

CLINICAL DATA: Shortness of breath.

EXAM:
CHEST - 2 VIEW

[chest pa]
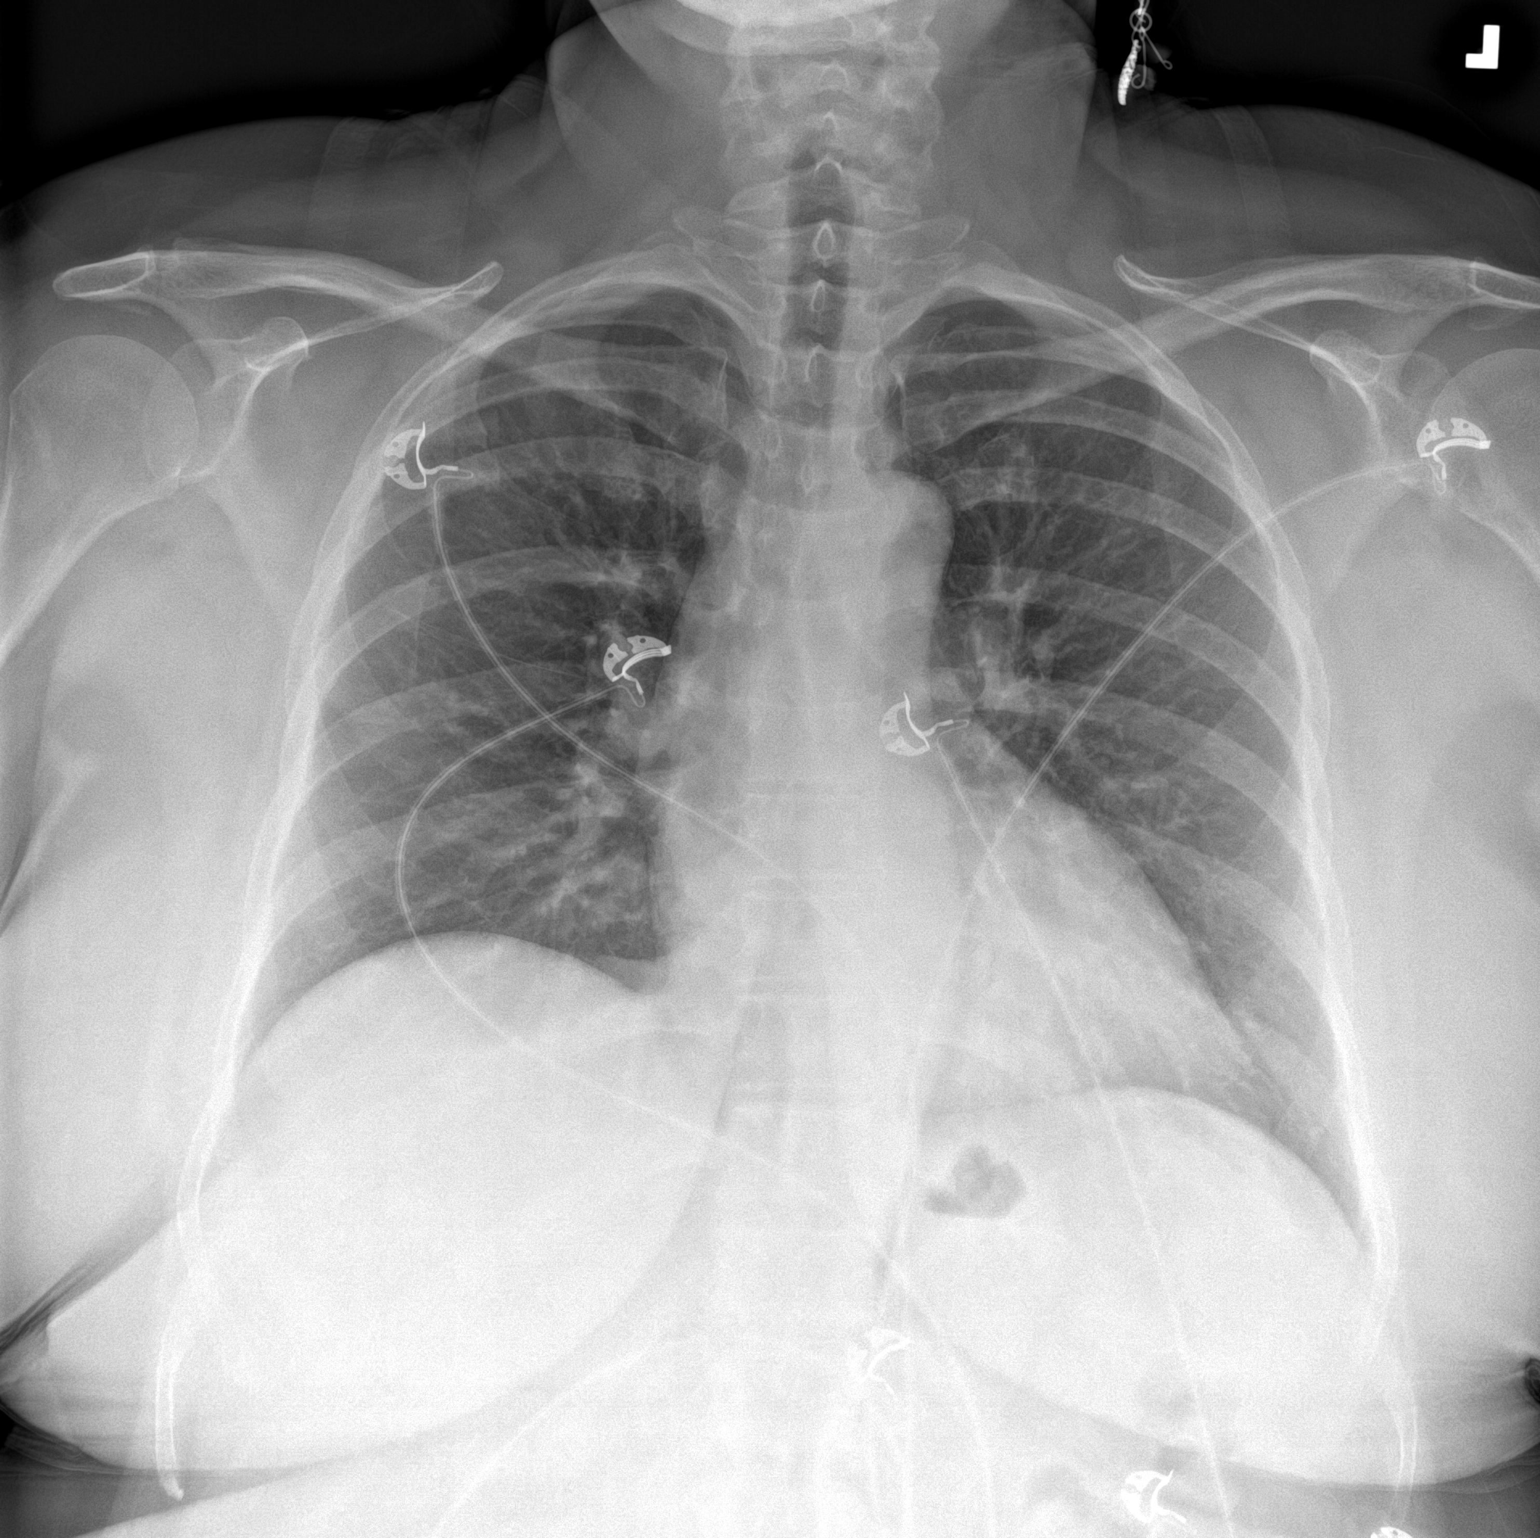

[chest lat]
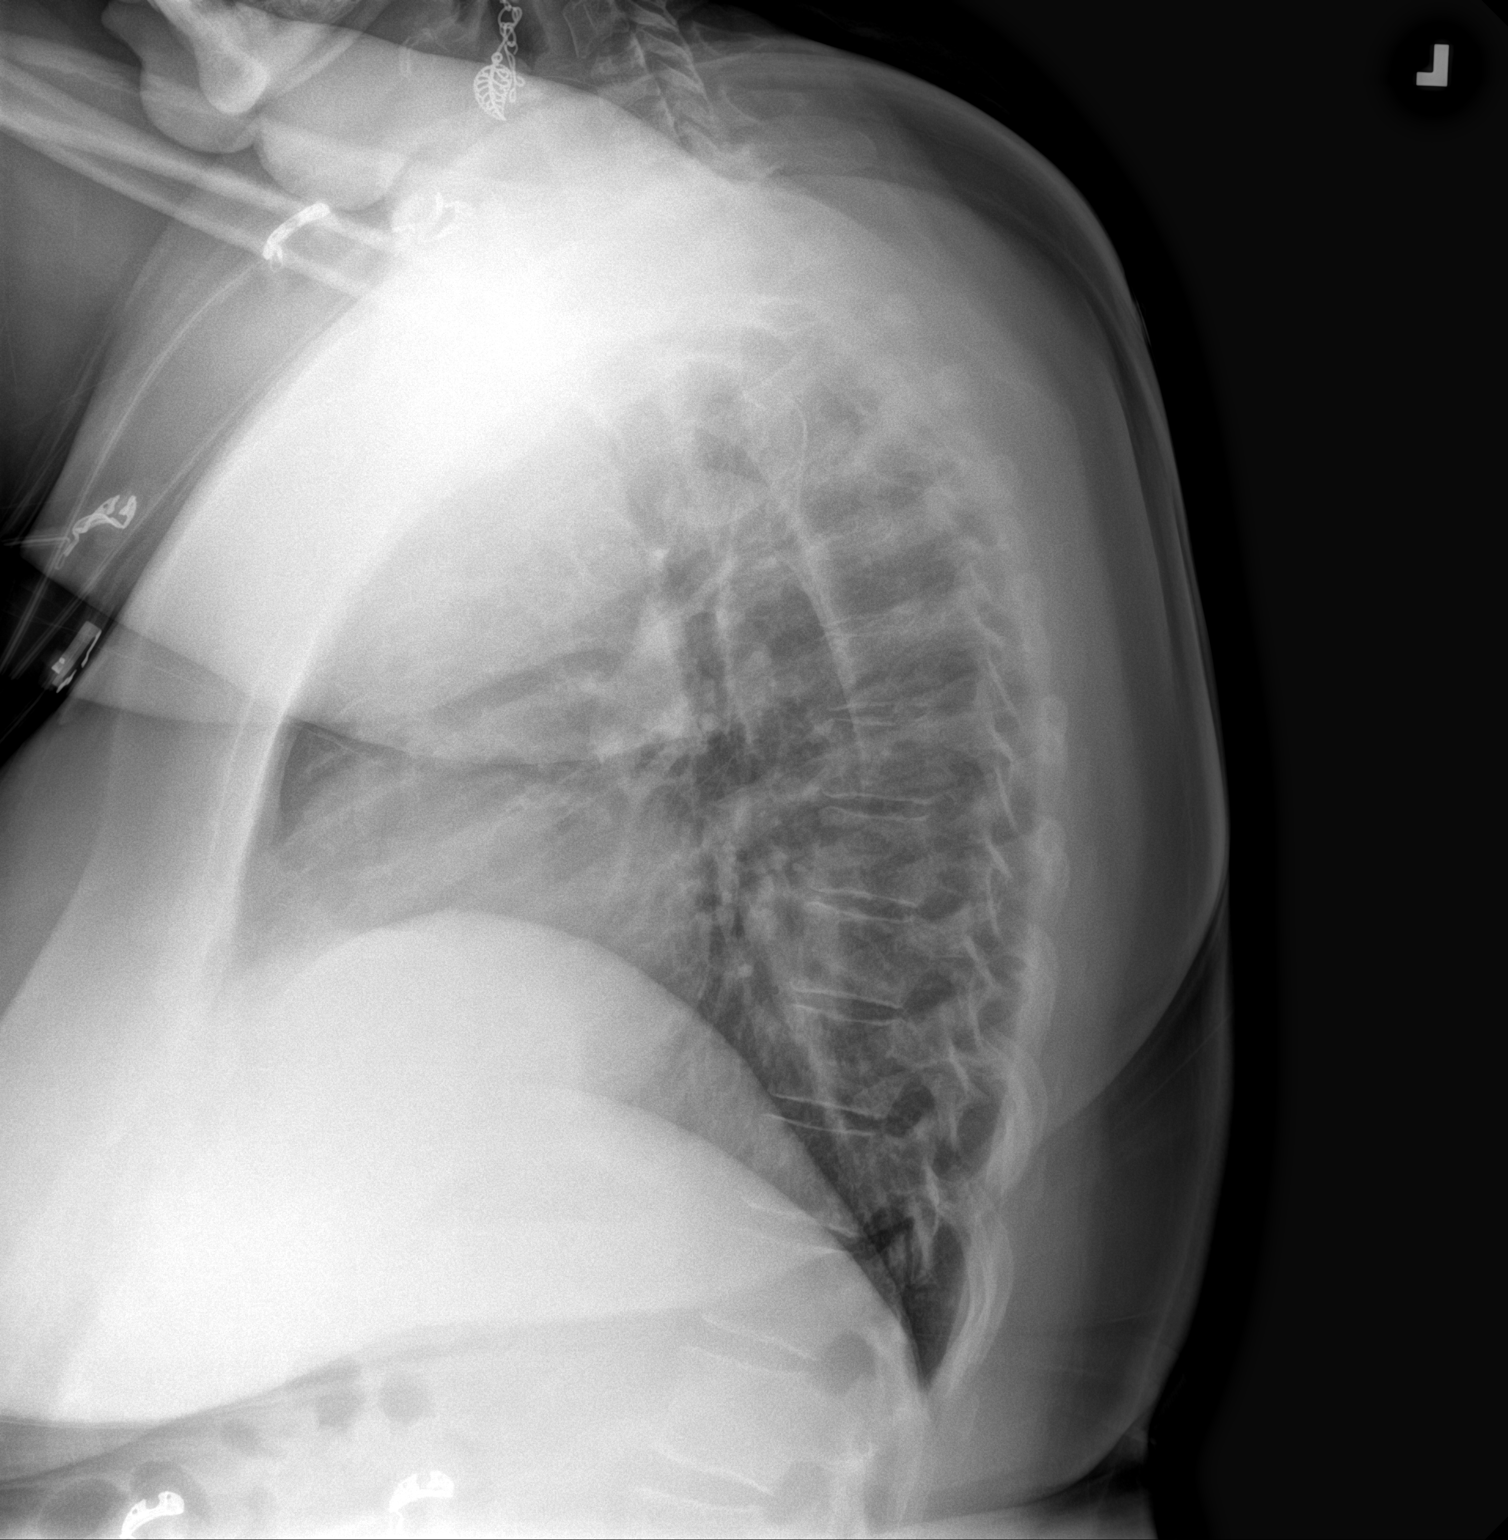

[2 of 2 positions shown; findings below may reference images not displayed]

FINDINGS: The heart size and mediastinal contours are within normal limits.
Compared to the prior study, there is slight increase in prominence
of pulmonary vascularity and interstitium suggestive some degree of
pulmonary interstitial edema or atypical pneumonia. No overt
airspace edema, pleural fluid, pneumothorax or consolidation is
identified. The visualized skeletal structures are unremarkable.
IMPRESSION: Suggestion of pulmonary interstitial edema or atypical interstitial
pneumonia since the prior study 2 days ago.

## 2023-02-01 ENCOUNTER — Encounter: Payer: Medicaid Other | Admitting: Family Medicine

## 2023-02-05 ENCOUNTER — Telehealth: Payer: Self-pay | Admitting: Orthopaedic Surgery

## 2023-02-05 NOTE — Telephone Encounter (Signed)
 All sounds like normal postop stuff.  Wouldn't really change anything at this time.

## 2023-02-05 NOTE — Telephone Encounter (Signed)
 Received call from Charri-(PT) with [facility] advised patient has Fx of left ankle. Charri said the incision is doing good. Zoila said there is increased swelling and pain in left ankle is a 5 out of 10. Charri said patient is doing go about not putting weight on left ankle. Charri said patient is also having nerve pain and can not put the boot on.   Charri said patient stopped taking the Hydrocodone  because she doesn't like the way the medication makes her feel. The number to contact Zoila is [phone]

## 2023-02-06 NOTE — Telephone Encounter (Signed)
 Relayed to [employer] via [employer].

## 2023-02-07 ENCOUNTER — Inpatient Hospital Stay: Payer: Medicaid Other | Admitting: Family Medicine

## 2023-02-20 ENCOUNTER — Other Ambulatory Visit (INDEPENDENT_AMBULATORY_CARE_PROVIDER_SITE_OTHER): Payer: Medicaid Other

## 2023-02-20 ENCOUNTER — Ambulatory Visit (INDEPENDENT_AMBULATORY_CARE_PROVIDER_SITE_OTHER): Payer: Medicaid Other | Admitting: Physician Assistant

## 2023-02-20 ENCOUNTER — Telehealth: Payer: Self-pay | Admitting: Radiology

## 2023-02-20 ENCOUNTER — Encounter: Payer: Self-pay | Admitting: Physician Assistant

## 2023-02-20 DIAGNOSIS — S82842A Displaced bimalleolar fracture of left lower leg, initial encounter for closed fracture: Secondary | ICD-10-CM

## 2023-02-20 MED ORDER — MELOXICAM 7.5 MG PO TABS: 7.5000 mg | ORAL_TABLET | Freq: Two times a day (BID) | ORAL | 1 refills | Status: AC | PRN

## 2023-02-20 NOTE — Progress Notes (Signed)
Post-Op Visit Note   Patient: Ashlee Farmer           Date of Birth: 1970-02-10           MRN: [id number] Visit Date: 02/20/2023 PCP: Georganna Skeans, MD   Assessment & Plan:  Chief Complaint:  Chief Complaint  Patient presents with   Left Ankle - Follow-up    ORIF bimall 01/08/2023   Visit Diagnoses:  1. Bimalleolar fracture, left, closed, initial encounter     Plan: Patient is a pleasant 53 year old female who comes in today 6 weeks status post ORIF left bimalleolar ankle fracture, date of surgery 01/08/2023.  She has been doing better.  She is taking Tylenol for pain.  She is currently nonweightbearing in a cam boot.  She is no longer taking Xarelto.  She tells me she was told she only needed to take this 6 months status post PE and did not need to take this any longer.  Examination of the left ankle reveals a fully healed lateral scar.  Medial scar has a small scab to the middle of the incision.  No signs of infection or cellulitis.  No bony tenderness.  She can dorsiflex to neutral.  Calf is soft nontender.  She is neurovascularly intact distally.  At this point, she may begin bearing weight in the cam boot.  We have provided her with an ASO brace to transition into when she is ready.  She tells me she does not have any transportation to get her to formal physical therapy and would like to continue home health PT.  We have extended this for another 6 weeks.  She will follow-up with [place] in 6 weeks for repeat evaluation and three-view x-rays of the left ankle.  From orthopedic standpoint, she no longer needs the Xarelto once she is bearing weight.  She will call us with any concerns or questions in the meantime.  Follow-Up Instructions: Return in about 6 weeks (around 04/03/2023).   Orders:  Orders Placed This Encounter  Procedures   XR Ankle Complete Left   No orders of the defined types were placed in this encounter.   Imaging: XR Ankle Complete Left Result Date:  02/20/2023 X-rays demonstrate continued consolidation to the fracture sites.  No hardware complication.   PMFS History: Patient Active Problem List   Diagnosis Date Noted   Bimalleolar ankle fracture, left, closed, initial encounter 01/08/2023   History of open reduction and internal fixation (ORIF) procedure 01/08/2023   Pain, dental 05/30/2021   Hypertensive crisis 08/29/2020   Class 3 severe obesity due to excess calories with serious comorbidity and body mass index (BMI) of 50.0 to 59.9 in adult [facility]) 08/03/2014   Essential hypertension 08/03/2014   Past Medical History:  Diagnosis Date   Allergy    Anxiety    Depression    Endometriosis of bladder    Family history of adverse reaction to anesthesia    post op nausea vomiting   GERD (gastroesophageal reflux disease)    Heart murmur    Hypertension    IBS (irritable bowel syndrome)    Pulmonary embolism (HCC) 08/29/2020    Family History  Problem Relation Age of Onset   Diabetes Mother    Hyperlipidemia Mother    Hypertension Mother    Diabetes Father    Heart disease Father    Hyperlipidemia Father    Hypertension Father    Hyperlipidemia Brother    Hypertension Brother     Past Surgical  History:  Procedure Laterality Date   ABLATION ON ENDOMETRIOSIS     ORIF ANKLE FRACTURE Left 01/08/2023   Procedure: OPEN REDUCTION INTERNAL FIXATION (ORIF) LEFT BIMALLEOLAR ANKLE FRACTURE;  Surgeon: Tarry Kos, MD;  Location: [facility] OR;  Service: Orthopedics;  Laterality: Left;   Social History   Occupational History   Not on file  Tobacco Use   Smoking status: Never   Smokeless tobacco: Never  Vaping Use   Vaping status: Never Used  Substance and Sexual Activity   Alcohol use: Not Currently   Drug use: No   Sexual activity: Not Currently

## 2023-02-20 NOTE — Telephone Encounter (Signed)
I've put in a PT referral for a patient to continue HHPT for 6 weeks.  Looks like you have previously e-mailed [facility] before.

## 2023-02-20 NOTE — Telephone Encounter (Signed)
I have emailed Ashlee Farmer with [facility] to continue HHPT services for 6 weeks.

## 2023-02-24 DIAGNOSIS — Z419 Encounter for procedure for purposes other than remedying health state, unspecified: Secondary | ICD-10-CM | POA: Diagnosis not present

## 2023-03-02 DIAGNOSIS — Z86711 Personal history of pulmonary embolism: Secondary | ICD-10-CM | POA: Diagnosis not present

## 2023-03-02 DIAGNOSIS — F419 Anxiety disorder, unspecified: Secondary | ICD-10-CM | POA: Diagnosis not present

## 2023-03-02 DIAGNOSIS — Z79891 Long term (current) use of opiate analgesic: Secondary | ICD-10-CM | POA: Diagnosis not present

## 2023-03-02 DIAGNOSIS — K219 Gastro-esophageal reflux disease without esophagitis: Secondary | ICD-10-CM | POA: Diagnosis not present

## 2023-03-02 DIAGNOSIS — S82852A Displaced trimalleolar fracture of left lower leg, initial encounter for closed fracture: Secondary | ICD-10-CM | POA: Diagnosis not present

## 2023-03-02 DIAGNOSIS — Z7901 Long term (current) use of anticoagulants: Secondary | ICD-10-CM | POA: Diagnosis not present

## 2023-03-02 DIAGNOSIS — F32A Depression, unspecified: Secondary | ICD-10-CM | POA: Diagnosis not present

## 2023-03-02 DIAGNOSIS — K589 Irritable bowel syndrome without diarrhea: Secondary | ICD-10-CM | POA: Diagnosis not present

## 2023-03-02 DIAGNOSIS — E669 Obesity, unspecified: Secondary | ICD-10-CM | POA: Diagnosis not present

## 2023-03-02 DIAGNOSIS — I10 Essential (primary) hypertension: Secondary | ICD-10-CM | POA: Diagnosis not present

## 2023-03-06 ENCOUNTER — Other Ambulatory Visit: Payer: Self-pay

## 2023-03-09 DIAGNOSIS — Z86711 Personal history of pulmonary embolism: Secondary | ICD-10-CM | POA: Diagnosis not present

## 2023-03-09 DIAGNOSIS — E669 Obesity, unspecified: Secondary | ICD-10-CM | POA: Diagnosis not present

## 2023-03-09 DIAGNOSIS — K589 Irritable bowel syndrome without diarrhea: Secondary | ICD-10-CM | POA: Diagnosis not present

## 2023-03-09 DIAGNOSIS — F419 Anxiety disorder, unspecified: Secondary | ICD-10-CM | POA: Diagnosis not present

## 2023-03-09 DIAGNOSIS — K219 Gastro-esophageal reflux disease without esophagitis: Secondary | ICD-10-CM | POA: Diagnosis not present

## 2023-03-09 DIAGNOSIS — F32A Depression, unspecified: Secondary | ICD-10-CM | POA: Diagnosis not present

## 2023-03-09 DIAGNOSIS — S82852A Displaced trimalleolar fracture of left lower leg, initial encounter for closed fracture: Secondary | ICD-10-CM | POA: Diagnosis not present

## 2023-03-09 DIAGNOSIS — I10 Essential (primary) hypertension: Secondary | ICD-10-CM | POA: Diagnosis not present

## 2023-03-09 DIAGNOSIS — Z79891 Long term (current) use of opiate analgesic: Secondary | ICD-10-CM | POA: Diagnosis not present

## 2023-03-09 DIAGNOSIS — Z7901 Long term (current) use of anticoagulants: Secondary | ICD-10-CM | POA: Diagnosis not present

## 2023-03-12 ENCOUNTER — Telehealth: Payer: Self-pay

## 2023-03-12 ENCOUNTER — Other Ambulatory Visit: Payer: Self-pay

## 2023-03-12 NOTE — Telephone Encounter (Signed)
Ashlee Farmer with [facility] would like to clarification on active ROM, strengthening of left ankle, and soft tissue mobilization.  CB# [phone].  Fax# [phone].  Please advise.  Thank you

## 2023-03-12 NOTE — Telephone Encounter (Signed)
Weight bear as tolerated. Range of motion as tolerated.

## 2023-03-13 NOTE — Telephone Encounter (Signed)
Called and notified Charisse.

## 2023-03-13 NOTE — Telephone Encounter (Signed)
That is fine to.  Thank you

## 2023-03-15 DIAGNOSIS — S82852D Displaced trimalleolar fracture of left lower leg, subsequent encounter for closed fracture with routine healing: Secondary | ICD-10-CM | POA: Diagnosis not present

## 2023-03-15 DIAGNOSIS — K219 Gastro-esophageal reflux disease without esophagitis: Secondary | ICD-10-CM | POA: Diagnosis not present

## 2023-03-15 DIAGNOSIS — I10 Essential (primary) hypertension: Secondary | ICD-10-CM | POA: Diagnosis not present

## 2023-03-15 DIAGNOSIS — Z6841 Body Mass Index (BMI) 40.0 and over, adult: Secondary | ICD-10-CM | POA: Diagnosis not present

## 2023-03-15 DIAGNOSIS — Z86711 Personal history of pulmonary embolism: Secondary | ICD-10-CM | POA: Diagnosis not present

## 2023-03-15 DIAGNOSIS — K589 Irritable bowel syndrome without diarrhea: Secondary | ICD-10-CM | POA: Diagnosis not present

## 2023-03-15 DIAGNOSIS — F419 Anxiety disorder, unspecified: Secondary | ICD-10-CM | POA: Diagnosis not present

## 2023-03-15 DIAGNOSIS — S82842D Displaced bimalleolar fracture of left lower leg, subsequent encounter for closed fracture with routine healing: Secondary | ICD-10-CM | POA: Diagnosis not present

## 2023-03-15 DIAGNOSIS — F32A Depression, unspecified: Secondary | ICD-10-CM | POA: Diagnosis not present

## 2023-03-15 DIAGNOSIS — E669 Obesity, unspecified: Secondary | ICD-10-CM | POA: Diagnosis not present

## 2023-03-20 DIAGNOSIS — S82852D Displaced trimalleolar fracture of left lower leg, subsequent encounter for closed fracture with routine healing: Secondary | ICD-10-CM | POA: Diagnosis not present

## 2023-03-20 DIAGNOSIS — K219 Gastro-esophageal reflux disease without esophagitis: Secondary | ICD-10-CM | POA: Diagnosis not present

## 2023-03-20 DIAGNOSIS — F32A Depression, unspecified: Secondary | ICD-10-CM | POA: Diagnosis not present

## 2023-03-20 DIAGNOSIS — Z6841 Body Mass Index (BMI) 40.0 and over, adult: Secondary | ICD-10-CM | POA: Diagnosis not present

## 2023-03-20 DIAGNOSIS — Z86711 Personal history of pulmonary embolism: Secondary | ICD-10-CM | POA: Diagnosis not present

## 2023-03-20 DIAGNOSIS — F419 Anxiety disorder, unspecified: Secondary | ICD-10-CM | POA: Diagnosis not present

## 2023-03-20 DIAGNOSIS — I10 Essential (primary) hypertension: Secondary | ICD-10-CM | POA: Diagnosis not present

## 2023-03-20 DIAGNOSIS — K589 Irritable bowel syndrome without diarrhea: Secondary | ICD-10-CM | POA: Diagnosis not present

## 2023-03-20 DIAGNOSIS — S82842D Displaced bimalleolar fracture of left lower leg, subsequent encounter for closed fracture with routine healing: Secondary | ICD-10-CM | POA: Diagnosis not present

## 2023-03-20 DIAGNOSIS — E669 Obesity, unspecified: Secondary | ICD-10-CM | POA: Diagnosis not present

## 2023-03-24 DIAGNOSIS — Z419 Encounter for procedure for purposes other than remedying health state, unspecified: Secondary | ICD-10-CM | POA: Diagnosis not present

## 2023-04-03 ENCOUNTER — Encounter: Payer: Self-pay | Admitting: Physician Assistant

## 2023-04-03 ENCOUNTER — Ambulatory Visit (INDEPENDENT_AMBULATORY_CARE_PROVIDER_SITE_OTHER): Payer: Medicaid Other | Admitting: Physician Assistant

## 2023-04-03 ENCOUNTER — Other Ambulatory Visit (INDEPENDENT_AMBULATORY_CARE_PROVIDER_SITE_OTHER): Payer: Self-pay

## 2023-04-03 DIAGNOSIS — S82842A Displaced bimalleolar fracture of left lower leg, initial encounter for closed fracture: Secondary | ICD-10-CM

## 2023-04-03 NOTE — Progress Notes (Signed)
 Post-Op Visit Note   Patient: Ashlee Farmer           Date of Birth: Dec 22, 1970           MRN: [id number] Visit Date: 04/03/2023 PCP: Georganna Skeans, MD   Assessment & Plan:  Chief Complaint:  Chief Complaint  Patient presents with   Left Ankle - Follow-up    ORIF bimall fracture 01/08/2023   Visit Diagnoses:  1. Bimalleolar fracture, left, closed, initial encounter     Plan: Patient is a pleasant 53 year old female who comes in today 3 months status post ORIF left bimalleolar ankle fracture 01/08/2023.  She has been doing much better.  She is taking NSAIDs for pain.  She has been getting home health physical therapy and making good progress.  She has been ambulating in an ASO brace at times and then in a cam boot at times.  Examination left ankle reveals no tenderness to fracture site.  Dorsiflexion to neutral.  She is neurovascularly intact distally.  At this point, would like for her to discontinue the cam boot altogether and ambulate in an ASO brace.  I will send in a referral for outpatient physical therapy to help with gait training and to continue with range of motion.  She will follow-up with [place] in 2 months for repeat evaluation.  Call with concerns or questions in the meantime.  Follow-Up Instructions: Return in about 2 months (around 06/03/2023).   Orders:  Orders Placed This Encounter  Procedures   XR Ankle Complete Left   Ambulatory referral to Physical Therapy   No orders of the defined types were placed in this encounter.   Imaging: XR Ankle Complete Left Result Date: 04/03/2023 X-rays demonstrate consolidation of the fracture site.  No hardware complication.   PMFS History: Patient Active Problem List   Diagnosis Date Noted   Bimalleolar ankle fracture, left, closed, initial encounter 01/08/2023   History of open reduction and internal fixation (ORIF) procedure 01/08/2023   Pain, dental 05/30/2021   Hypertensive crisis 08/29/2020   Class 3 severe  obesity due to excess calories with serious comorbidity and body mass index (BMI) of 50.0 to 59.9 in adult [facility]) 08/03/2014   Essential hypertension 08/03/2014   Past Medical History:  Diagnosis Date   Allergy    Anxiety    Depression    Endometriosis of bladder    Family history of adverse reaction to anesthesia    post op nausea vomiting   GERD (gastroesophageal reflux disease)    Heart murmur    Hypertension    IBS (irritable bowel syndrome)    Pulmonary embolism (HCC) 08/29/2020    Family History  Problem Relation Age of Onset   Diabetes Mother    Hyperlipidemia Mother    Hypertension Mother    Diabetes Father    Heart disease Father    Hyperlipidemia Father    Hypertension Father    Hyperlipidemia Brother    Hypertension Brother     Past Surgical History:  Procedure Laterality Date   ABLATION ON ENDOMETRIOSIS     ORIF ANKLE FRACTURE Left 01/08/2023   Procedure: OPEN REDUCTION INTERNAL FIXATION (ORIF) LEFT BIMALLEOLAR ANKLE FRACTURE;  Surgeon: Tarry Kos, MD;  Location: [facility] OR;  Service: Orthopedics;  Laterality: Left;   Social History   Occupational History   Not on file  Tobacco Use   Smoking status: Never   Smokeless tobacco: Never  Vaping Use   Vaping status: Never Used  Substance and  Sexual Activity   Alcohol use: Not Currently   Drug use: No   Sexual activity: Not Currently

## 2023-04-12 ENCOUNTER — Ambulatory Visit

## 2023-04-18 ENCOUNTER — Ambulatory Visit: Admitting: Physical Therapy

## 2023-05-05 DIAGNOSIS — Z419 Encounter for procedure for purposes other than remedying health state, unspecified: Secondary | ICD-10-CM | POA: Diagnosis not present

## 2023-06-04 DIAGNOSIS — Z419 Encounter for procedure for purposes other than remedying health state, unspecified: Secondary | ICD-10-CM | POA: Diagnosis not present

## 2023-06-05 ENCOUNTER — Ambulatory Visit (INDEPENDENT_AMBULATORY_CARE_PROVIDER_SITE_OTHER): Admitting: Orthopaedic Surgery

## 2023-06-05 ENCOUNTER — Encounter: Payer: Self-pay | Admitting: Orthopaedic Surgery

## 2023-06-05 DIAGNOSIS — E66813 Obesity, class 3: Secondary | ICD-10-CM | POA: Diagnosis not present

## 2023-06-05 DIAGNOSIS — Z6841 Body Mass Index (BMI) 40.0 and over, adult: Secondary | ICD-10-CM | POA: Diagnosis not present

## 2023-06-05 DIAGNOSIS — M25561 Pain in right knee: Secondary | ICD-10-CM | POA: Diagnosis not present

## 2023-06-05 DIAGNOSIS — S82842A Displaced bimalleolar fracture of left lower leg, initial encounter for closed fracture: Secondary | ICD-10-CM

## 2023-06-05 DIAGNOSIS — G8929 Other chronic pain: Secondary | ICD-10-CM

## 2023-06-05 NOTE — Progress Notes (Signed)
 Office Visit Note   Patient: Ashlee Farmer           Date of Birth: 1970/06/20           MRN: [id number] Visit Date: 06/05/2023              Requested by: Abraham Abo, MD [address] [place],  [place] [address] PCP: Abraham Abo, MD   Assessment & Plan: Visit Diagnoses:  1. Bimalleolar fracture, left, closed, initial encounter   2. Chronic pain of right knee   3. Class 3 severe obesity due to excess calories with serious comorbidity and body mass index (BMI) of 50.0 to 59.9 in adult     Plan: History of Present Illness Ashlee Farmer is a 53 year old female who presents for follow-up regarding her recovery from an ankle fracture.  She is five months post-ankle fracture with overall improvement in function and pain. She ambulates with a cane, which alleviates right knee discomfort caused by compensation with the boot. She inquires about the duration for wearing the ASO and uses a compression sock, which provides significant relief. She sometimes uses an additional stretch sock with an elastic cross part for added support. Residual swelling and tightness in her shoe are attributed to swelling around the condyles, and she experiences a tingling sensation on the left front part of her ankle.  She was previously engaged in in-home physical therapy but was dismissed due to knee discomfort. She does not feel safe attending outpatient therapy. The knee pain is on the medial side, and she sometimes uses kinesiology tape for support, which she finds helpful.  She recently purchased rigid shoes based on a recommendation from a shoe store employee who had undergone similar surgery, and she feels these shoes provide better support.  Physical Exam MUSCULOSKELETAL: Ankle mobility and range of motion normal.  Fully healed surgical scars.    Assessment and Plan Ankle fracture Five months post-fracture with improved function and pain. Residual swelling and tingling from surgery  expected to resolve over time. Decision to discontinue ASO is patient-dependent based on confidence in ankle stability. - Continue using compression sock and elastic support as needed. - Discontinue ASO when confident in ankle stability.  Right knee pain Pain likely due to compensation from ankle fracture and boot use. Localized to medial side, possibly from degenerative meniscus tear or arthritis. Cortisone injection discussed but deferred. - Use kinesiology tape for knee support. - Consider cortisone injection if pain persists.  Follow-Up Instructions: No follow-ups on file.   Orders:  No orders of the defined types were placed in this encounter.  No orders of the defined types were placed in this encounter.    Subjective: Chief Complaint  Patient presents with   Left Ankle - Follow-up    ORIF bimall 01/08/2023    HPI  Review of Systems  Constitutional: Negative.   HENT: Negative.    Eyes: Negative.   Respiratory: Negative.    Cardiovascular: Negative.   Endocrine: Negative.   Musculoskeletal: Negative.   Neurological: Negative.   Hematological: Negative.   Psychiatric/Behavioral: Negative.    All other systems reviewed and are negative.    Objective: Vital Signs: LMP 06/17/2015   Physical Exam Vitals and nursing note reviewed.  Constitutional:      Appearance: She is well-developed.  HENT:     Head: Atraumatic.     Nose: Nose normal.  Eyes:     Extraocular Movements: Extraocular movements intact.  Cardiovascular:  Pulses: Normal pulses.  Pulmonary:     Effort: Pulmonary effort is normal.  Abdominal:     Palpations: Abdomen is soft.  Musculoskeletal:     Cervical back: Neck supple.  Skin:    General: Skin is warm.     Capillary Refill: Capillary refill takes less than 2 seconds.  Neurological:     Mental Status: She is alert. Mental status is at baseline.  Psychiatric:        Behavior: Behavior normal.        Thought Content: Thought content  normal.        Judgment: Judgment normal.     Ortho Exam  Specialty Comments:  No specialty comments available.  Imaging: No results found.   PMFS History: Patient Active Problem List   Diagnosis Date Noted   Bimalleolar fracture, left, closed, initial encounter 01/08/2023   History of open reduction and internal fixation (ORIF) procedure 01/08/2023   Pain, dental 05/30/2021   Hypertensive crisis 08/29/2020   Class 3 severe obesity due to excess calories with serious comorbidity and body mass index (BMI) of 50.0 to 59.9 in adult 08/03/2014   Essential hypertension 08/03/2014   Past Medical History:  Diagnosis Date   Allergy    Anxiety    Depression    Endometriosis of bladder    Family history of adverse reaction to anesthesia    post op nausea vomiting   GERD (gastroesophageal reflux disease)    Heart murmur    Hypertension    IBS (irritable bowel syndrome)    Pulmonary embolism (HCC) 08/29/2020    Family History  Problem Relation Age of Onset   Diabetes Mother    Hyperlipidemia Mother    Hypertension Mother    Diabetes Father    Heart disease Father    Hyperlipidemia Father    Hypertension Father    Hyperlipidemia Brother    Hypertension Brother     Past Surgical History:  Procedure Laterality Date   ABLATION ON ENDOMETRIOSIS     ORIF ANKLE FRACTURE Left 01/08/2023   Procedure: OPEN REDUCTION INTERNAL FIXATION (ORIF) LEFT BIMALLEOLAR ANKLE FRACTURE;  Surgeon: Wes Hamman, MD;  Location: [facility] OR;  Service: Orthopedics;  Laterality: Left;   Social History   Occupational History   Not on file  Tobacco Use   Smoking status: Never   Smokeless tobacco: Never  Vaping Use   Vaping status: Never Used  Substance and Sexual Activity   Alcohol use: Not Currently   Drug use: No   Sexual activity: Not Currently

## 2023-06-07 ENCOUNTER — Other Ambulatory Visit: Payer: Self-pay | Admitting: Family Medicine

## 2023-06-07 DIAGNOSIS — I10 Essential (primary) hypertension: Secondary | ICD-10-CM

## 2023-06-14 ENCOUNTER — Ambulatory Visit (INDEPENDENT_AMBULATORY_CARE_PROVIDER_SITE_OTHER): Admitting: Family Medicine

## 2023-06-14 ENCOUNTER — Encounter: Payer: Self-pay | Admitting: Family Medicine

## 2023-06-14 VITALS — BP 137/83 | HR 97 | Wt 278.8 lb

## 2023-06-14 DIAGNOSIS — E66813 Obesity, class 3: Secondary | ICD-10-CM | POA: Diagnosis not present

## 2023-06-14 DIAGNOSIS — I10 Essential (primary) hypertension: Secondary | ICD-10-CM

## 2023-06-14 DIAGNOSIS — E785 Hyperlipidemia, unspecified: Secondary | ICD-10-CM | POA: Diagnosis not present

## 2023-06-14 DIAGNOSIS — Z6841 Body Mass Index (BMI) 40.0 and over, adult: Secondary | ICD-10-CM | POA: Diagnosis not present

## 2023-06-14 MED ORDER — AMLODIPINE BESYLATE 10 MG PO TABS: 10.0000 mg | ORAL_TABLET | Freq: Every day | ORAL | 1 refills | Status: DC

## 2023-06-14 MED ORDER — HYDRALAZINE HCL 25 MG PO TABS: 25.0000 mg | ORAL_TABLET | Freq: Three times a day (TID) | ORAL | 1 refills | Status: AC

## 2023-06-14 MED ORDER — LOSARTAN POTASSIUM 100 MG PO TABS: 100.0000 mg | ORAL_TABLET | Freq: Every day | ORAL | 1 refills | Status: DC

## 2023-06-14 NOTE — Progress Notes (Signed)
 Established Patient Office Visit  Subjective    Patient ID: Ashlee Farmer, female    DOB: 22-Apr-1970  Age: 53 y.o. MRN: [id number]  CC:  Chief Complaint  Patient presents with   Hypertension   Medication Refill    HPI Ashlee Farmer presents for routine followup of hypertension. Patient reports med compliance and denies acute complaints.   Outpatient Encounter Medications as of 06/14/2023  Medication Sig   Carboxymethylcell-Glycerin PF (REFRESH TEARS PF) 0.5-0.9 % SOLN Place 1 drop into both eyes daily as needed (Dry eye).   HYDROcodone -acetaminophen  (NORCO) 5-325 MG tablet Take 1-2 tablets by mouth 3 (three) times daily as needed.   meloxicam  (MOBIC ) 7.5 MG tablet Take 1 tablet (7.5 mg total) by mouth 2 (two) times daily as needed for pain.   ondansetron  (ZOFRAN ) 4 MG tablet Take 1 tablet (4 mg total) by mouth every 8 (eight) hours as needed for nausea or vomiting.   pantoprazole  (PROTONIX ) 40 MG tablet Take 1 tablet (40 mg total) by mouth 2 (two) times daily. (Patient taking differently: Take 40 mg by mouth daily.)   rivaroxaban  (XARELTO ) 10 MG TABS tablet Take 1 tablet (10 mg total) by mouth daily. To be taken after surgery to prevent blood clots   simvastatin  (ZOCOR ) 20 MG tablet Take 1 tablet (20 mg total) by mouth once daily at 6 PM.   [DISCONTINUED] amLODipine  (NORVASC ) 10 MG tablet Take 1 tablet (10 mg total) by mouth daily.   [DISCONTINUED] hydrALAZINE  (APRESOLINE ) 25 MG tablet Take 1 tablet (25 mg total) by mouth every 8 (eight) hours. (Patient taking differently: Take 25 mg by mouth daily.)   [DISCONTINUED] losartan  (COZAAR ) 100 MG tablet Take 1 tablet (100 mg total) by mouth daily.   amLODipine  (NORVASC ) 10 MG tablet Take 1 tablet (10 mg total) by mouth daily.   hydrALAZINE  (APRESOLINE ) 25 MG tablet Take 1 tablet (25 mg total) by mouth every 8 (eight) hours.   losartan  (COZAAR ) 100 MG tablet Take 1 tablet (100 mg total) by mouth daily.   No facility-administered  encounter medications on file as of 06/14/2023.    Past Medical History:  Diagnosis Date   Allergy    Anxiety    Depression    Endometriosis of bladder    Family history of adverse reaction to anesthesia    post op nausea vomiting   GERD (gastroesophageal reflux disease)    Heart murmur    Hypertension    IBS (irritable bowel syndrome)    Pulmonary embolism (HCC) 08/29/2020    Past Surgical History:  Procedure Laterality Date   ABLATION ON ENDOMETRIOSIS     ORIF ANKLE FRACTURE Left 01/08/2023   Procedure: OPEN REDUCTION INTERNAL FIXATION (ORIF) LEFT BIMALLEOLAR ANKLE FRACTURE;  Surgeon: Wes Hamman, MD;  Location: [facility] OR;  Service: Orthopedics;  Laterality: Left;    Family History  Problem Relation Age of Onset   Diabetes Mother    Hyperlipidemia Mother    Hypertension Mother    Diabetes Father    Heart disease Father    Hyperlipidemia Father    Hypertension Father    Hyperlipidemia Brother    Hypertension Brother     Social History   Socioeconomic History   Marital status: Divorced    Spouse name: Not on file   Number of children: Not on file   Years of education: Not on file   Highest education level: Not on file  Occupational History   Not on file  Tobacco Use  Smoking status: Never   Smokeless tobacco: Never  Vaping Use   Vaping status: Never Used  Substance and Sexual Activity   Alcohol use: Not Currently   Drug use: No   Sexual activity: Not Currently  Other Topics Concern   Not on file  Social History Narrative   Not on file   Social Drivers of Health   Financial Resource Strain: Not on file  Food Insecurity: Patient Declined (01/09/2023)   Hunger Vital Sign    Worried About Running Out of Food in the Last Year: Patient declined    Ran Out of Food in the Last Year: Patient declined  Transportation Needs: No Transportation Needs (01/09/2023)   PRAPARE - Administrator, Civil Service (Medical): No    Lack of Transportation  (Non-Medical): No  Physical Activity: Not on file  Stress: Not on file  Social Connections: Not on file  Intimate Partner Violence: Not At Risk (01/09/2023)   Humiliation, Afraid, Rape, and Kick questionnaire    Fear of Current or Ex-Partner: No    Emotionally Abused: No    Physically Abused: No    Sexually Abused: No    Review of Systems  All other systems reviewed and are negative.       Objective    BP 137/83 (BP Location: Right Arm, Patient Position: Sitting, Cuff Size: Large)   Pulse 97   Wt 278 lb 12.8 oz (126.5 kg)   LMP 06/17/2015   SpO2 98%   BMI 52.68 kg/m   Physical Exam Vitals and nursing note reviewed.  Constitutional:      General: She is not in acute distress.    Appearance: She is obese.  Cardiovascular:     Rate and Rhythm: Normal rate and regular rhythm.  Pulmonary:     Effort: Pulmonary effort is normal.     Breath sounds: Normal breath sounds.  Abdominal:     Palpations: Abdomen is soft.     Tenderness: There is no abdominal tenderness.  Neurological:     General: No focal deficit present.     Mental Status: She is alert and oriented to person, place, and time.         Assessment & Plan:   Essential hypertension -     Losartan  Potassium; Take 1 tablet (100 mg total) by mouth daily.  Dispense: 90 tablet; Refill: 1 -     hydrALAZINE  HCl; Take 1 tablet (25 mg total) by mouth every 8 (eight) hours.  Dispense: 270 tablet; Refill: 1 -     amLODIPine  Besylate; Take 1 tablet (10 mg total) by mouth daily.  Dispense: 90 tablet; Refill: 1  Hyperlipidemia, unspecified hyperlipidemia type  Class 3 severe obesity due to excess calories with serious comorbidity and body mass index (BMI) of 50.0 to 59.9 in adult     Return in about 6 months (around 12/15/2023) for follow up.   Arlo Lama, MD

## 2023-06-15 ENCOUNTER — Encounter: Payer: Self-pay | Admitting: Family Medicine

## 2023-07-05 DIAGNOSIS — Z419 Encounter for procedure for purposes other than remedying health state, unspecified: Secondary | ICD-10-CM | POA: Diagnosis not present

## 2023-08-04 DIAGNOSIS — Z419 Encounter for procedure for purposes other than remedying health state, unspecified: Secondary | ICD-10-CM | POA: Diagnosis not present

## 2023-09-04 DIAGNOSIS — Z419 Encounter for procedure for purposes other than remedying health state, unspecified: Secondary | ICD-10-CM | POA: Diagnosis not present

## 2023-10-05 DIAGNOSIS — Z419 Encounter for procedure for purposes other than remedying health state, unspecified: Secondary | ICD-10-CM | POA: Diagnosis not present

## 2023-11-04 DIAGNOSIS — Z419 Encounter for procedure for purposes other than remedying health state, unspecified: Secondary | ICD-10-CM | POA: Diagnosis not present

## 2023-11-26 ENCOUNTER — Encounter: Payer: Self-pay | Admitting: Radiology

## 2023-12-17 ENCOUNTER — Ambulatory Visit: Admitting: Family Medicine

## 2024-02-13 ENCOUNTER — Encounter: Payer: Self-pay | Admitting: Family Medicine

## 2024-02-13 ENCOUNTER — Ambulatory Visit (INDEPENDENT_AMBULATORY_CARE_PROVIDER_SITE_OTHER): Admitting: Family Medicine

## 2024-02-13 VITALS — BP 158/90 | HR 83 | Ht 61.0 in | Wt 299.8 lb

## 2024-02-13 DIAGNOSIS — Z6841 Body Mass Index (BMI) 40.0 and over, adult: Secondary | ICD-10-CM

## 2024-02-13 DIAGNOSIS — I10 Essential (primary) hypertension: Secondary | ICD-10-CM

## 2024-02-13 DIAGNOSIS — E785 Hyperlipidemia, unspecified: Secondary | ICD-10-CM | POA: Diagnosis not present

## 2024-02-13 DIAGNOSIS — K219 Gastro-esophageal reflux disease without esophagitis: Secondary | ICD-10-CM

## 2024-02-13 DIAGNOSIS — E66813 Obesity, class 3: Secondary | ICD-10-CM | POA: Diagnosis not present

## 2024-02-13 MED ORDER — HYDRALAZINE HCL 50 MG PO TABS: 50.0000 mg | ORAL_TABLET | Freq: Three times a day (TID) | ORAL | 0 refills | Status: AC

## 2024-02-13 NOTE — Progress Notes (Signed)
 "  Established Patient Office Visit  Subjective    Patient ID: Ashlee Farmer, female    DOB: Sep 23, 1970  Age: 54 y.o. MRN: [id number]  CC:  Chief Complaint  Patient presents with   Medical Management of Chronic Issues    BP check     HPI Ashlee Farmer presents for routine follow up of hypertension. Ptient reports med compliance and denies acute complaint.   Outpatient Encounter Medications as of 02/13/2024  Medication Sig   amLODipine  (NORVASC ) 10 MG tablet Take 1 tablet (10 mg total) by mouth daily.   hydrALAZINE  (APRESOLINE ) 25 MG tablet Take 1 tablet (25 mg total) by mouth every 8 (eight) hours.   hydrALAZINE  (APRESOLINE ) 50 MG tablet Take 1 tablet (50 mg total) by mouth 3 (three) times daily.   losartan  (COZAAR ) 100 MG tablet Take 1 tablet (100 mg total) by mouth daily.   Carboxymethylcell-Glycerin PF (REFRESH TEARS PF) 0.5-0.9 % SOLN Place 1 drop into both eyes daily as needed (Dry eye).   HYDROcodone -acetaminophen  (NORCO) 5-325 MG tablet Take 1-2 tablets by mouth 3 (three) times daily as needed.   meloxicam  (MOBIC ) 7.5 MG tablet Take 1 tablet (7.5 mg total) by mouth 2 (two) times daily as needed for pain.   ondansetron  (ZOFRAN ) 4 MG tablet Take 1 tablet (4 mg total) by mouth every 8 (eight) hours as needed for nausea or vomiting.   pantoprazole  (PROTONIX ) 40 MG tablet Take 1 tablet (40 mg total) by mouth 2 (two) times daily. (Patient taking differently: Take 40 mg by mouth daily.)   rivaroxaban  (XARELTO ) 10 MG TABS tablet Take 1 tablet (10 mg total) by mouth daily. To be taken after surgery to prevent blood clots   simvastatin  (ZOCOR ) 20 MG tablet TAKE 1 TABLET BY MOUTH EVERY DAY AT 6PM   No facility-administered encounter medications on file as of 02/13/2024.    Past Medical History:  Diagnosis Date   Allergy    Anxiety    Depression    Endometriosis of bladder    Family history of adverse reaction to anesthesia    post op nausea vomiting   GERD (gastroesophageal  reflux disease)    Heart murmur    Hypertension    IBS (irritable bowel syndrome)    Pulmonary embolism (HCC) 08/29/2020    Past Surgical History:  Procedure Laterality Date   ABLATION ON ENDOMETRIOSIS     ORIF ANKLE FRACTURE Left 01/08/2023   Procedure: OPEN REDUCTION INTERNAL FIXATION (ORIF) LEFT BIMALLEOLAR ANKLE FRACTURE;  Surgeon: Jerri Kay HERO, MD;  Location: [facility] OR;  Service: Orthopedics;  Laterality: Left;    Family History  Problem Relation Age of Onset   Diabetes Mother    Hyperlipidemia Mother    Hypertension Mother    Diabetes Father    Heart disease Father    Hyperlipidemia Father    Hypertension Father    Hyperlipidemia Brother    Hypertension Brother     Social History   Socioeconomic History   Marital status: Divorced    Spouse name: Not on file   Number of children: Not on file   Years of education: Not on file   Highest education level: Not on file  Occupational History   Not on file  Tobacco Use   Smoking status: Never   Smokeless tobacco: Never  Vaping Use   Vaping status: Never Used  Substance and Sexual Activity   Alcohol use: Not Currently   Drug use: No   Sexual activity: Not  Currently  Other Topics Concern   Not on file  Social History Narrative   Not on file   Social Drivers of Health   Tobacco Use: Low Risk (02/13/2024)   Patient History    Smoking Tobacco Use: Never    Smokeless Tobacco Use: Never    Passive Exposure: Not on file  Financial Resource Strain: Low Risk (02/13/2024)   Overall Financial Resource Strain (CARDIA)    Difficulty of Paying Living Expenses: Not hard at all  Food Insecurity: Patient Declined (01/09/2023)   Hunger Vital Sign    Worried About Running Out of Food in the Last Year: Patient declined    Ran Out of Food in the Last Year: Patient declined  Transportation Needs: No Transportation Needs (01/09/2023)   PRAPARE - Administrator, Civil Service (Medical): No    Lack of Transportation  (Non-Medical): No  Physical Activity: Inactive (02/13/2024)   Exercise Vital Sign    Days of Exercise per Week: 0 days    Minutes of Exercise per Session: 0 min  Stress: No Stress Concern Present (02/13/2024)   [employer] of Occupational Health - Occupational Stress Questionnaire    Feeling of Stress: Not at all  Social Connections: Moderately Isolated (02/13/2024)   Social Connection and Isolation Panel    Frequency of Communication with Friends and Family: Three times a week    Frequency of Social Gatherings with Friends and Family: Three times a week    Attends Religious Services: Never    Active Member of Clubs or Organizations: Yes    Attends Banker Meetings: Never    Marital Status: Divorced  Catering Manager Violence: Not At Risk (01/09/2023)   Humiliation, Afraid, Rape, and Kick questionnaire    Fear of Current or Ex-Partner: No    Emotionally Abused: No    Physically Abused: No    Sexually Abused: No  Depression (PHQ2-9): Low Risk (06/14/2023)   Depression (PHQ2-9)    PHQ-2 Score: 0  Alcohol Screen: Low Risk (02/13/2024)   Alcohol Screen    Last Alcohol Screening Score (AUDIT): 0  Housing: Patient Declined (01/09/2023)   Housing Stability Vital Sign    Unable to Pay for Housing in the Last Year: Patient declined    Number of Times Moved in the Last Year: 0    Homeless in the Last Year: Patient declined  Utilities: Not At Risk (01/09/2023)   [facility] Utilities    Threatened with loss of utilities: No  Health Literacy: Adequate Health Literacy (02/13/2024)   B1300 Health Literacy    Frequency of need for help with medical instructions: Never    Review of Systems  All other systems reviewed and are negative.       Objective    BP (!) 158/90   Pulse 83   Ht 5' 1 (1.549 m)   Wt 299 lb 12.8 oz (136 kg)   LMP 06/17/2015   SpO2 93%   BMI 56.65 kg/m   Physical Exam Vitals and nursing note reviewed.  Constitutional:      General: She is not in  acute distress. Cardiovascular:     Rate and Rhythm: Normal rate and regular rhythm.  Pulmonary:     Effort: Pulmonary effort is normal.     Breath sounds: Normal breath sounds.  Abdominal:     Palpations: Abdomen is soft.     Tenderness: There is no abdominal tenderness.  Neurological:     General: No focal deficit present.  Mental Status: She is alert and oriented to person, place, and time.         Assessment & Plan:   Essential hypertension  Hyperlipidemia, unspecified hyperlipidemia type  Gastroesophageal reflux disease without esophagitis  Class 3 severe obesity due to excess calories with serious comorbidity and body mass index (BMI) of 50.0 to 59.9 in adult [facility])  Other orders -     hydrALAZINE  HCl; Take 1 tablet (50 mg total) by mouth 3 (three) times daily.  Dispense: 90 tablet; Refill: 0     Return in about 6 months (around 08/12/2024) for follow up, chronic med issues.   Tanda Raguel SQUIBB, MD  "

## 2024-02-22 ENCOUNTER — Other Ambulatory Visit: Payer: Self-pay | Admitting: Family Medicine

## 2024-02-22 DIAGNOSIS — I10 Essential (primary) hypertension: Secondary | ICD-10-CM

## 2024-08-12 ENCOUNTER — Ambulatory Visit: Payer: Self-pay | Admitting: Family Medicine
